# Patient Record
Sex: Male | Born: 1970 | Race: Black or African American | Hispanic: No | Marital: Single | Smoking: Current every day smoker
Health system: Southern US, Community
[De-identification: ages and names within clinical notes are randomized; demographics above are authoritative.]

## PROBLEM LIST (undated history)

## (undated) DIAGNOSIS — E7800 Pure hypercholesterolemia, unspecified: Secondary | ICD-10-CM

## (undated) DIAGNOSIS — I10 Essential (primary) hypertension: Secondary | ICD-10-CM

## (undated) DIAGNOSIS — F250 Schizoaffective disorder, bipolar type: Secondary | ICD-10-CM

## (undated) DIAGNOSIS — F259 Schizoaffective disorder, unspecified: Secondary | ICD-10-CM

## (undated) HISTORY — PX: CHOLECYSTECTOMY: SHX55

---

## 2005-10-20 ENCOUNTER — Ambulatory Visit: Payer: Self-pay | Admitting: Nurse Practitioner

## 2005-10-22 ENCOUNTER — Ambulatory Visit: Payer: Self-pay | Admitting: *Deleted

## 2005-10-26 ENCOUNTER — Ambulatory Visit: Payer: Self-pay | Admitting: Nurse Practitioner

## 2005-11-01 ENCOUNTER — Ambulatory Visit: Payer: Self-pay | Admitting: Nurse Practitioner

## 2005-11-26 ENCOUNTER — Ambulatory Visit: Payer: Self-pay | Admitting: Nurse Practitioner

## 2006-08-02 ENCOUNTER — Emergency Department (HOSPITAL_COMMUNITY): Admission: EM | Admit: 2006-08-02 | Discharge: 2006-08-02 | Payer: Self-pay | Admitting: Emergency Medicine

## 2006-12-27 ENCOUNTER — Emergency Department (HOSPITAL_COMMUNITY): Admission: EM | Admit: 2006-12-27 | Discharge: 2006-12-28 | Payer: Self-pay | Admitting: Emergency Medicine

## 2007-03-24 ENCOUNTER — Emergency Department (HOSPITAL_COMMUNITY): Admission: EM | Admit: 2007-03-24 | Discharge: 2007-03-24 | Payer: Self-pay | Admitting: Emergency Medicine

## 2007-06-05 DIAGNOSIS — F528 Other sexual dysfunction not due to a substance or known physiological condition: Secondary | ICD-10-CM | POA: Insufficient documentation

## 2007-06-05 DIAGNOSIS — F259 Schizoaffective disorder, unspecified: Secondary | ICD-10-CM

## 2007-06-20 ENCOUNTER — Emergency Department (HOSPITAL_COMMUNITY): Admission: EM | Admit: 2007-06-20 | Discharge: 2007-06-20 | Payer: Self-pay | Admitting: Emergency Medicine

## 2008-11-15 ENCOUNTER — Ambulatory Visit: Payer: Self-pay | Admitting: Family Medicine

## 2008-11-18 ENCOUNTER — Ambulatory Visit: Payer: Self-pay | Admitting: Internal Medicine

## 2008-11-29 ENCOUNTER — Encounter (INDEPENDENT_AMBULATORY_CARE_PROVIDER_SITE_OTHER): Payer: Self-pay | Admitting: Internal Medicine

## 2008-11-29 ENCOUNTER — Ambulatory Visit: Payer: Self-pay | Admitting: Internal Medicine

## 2008-11-29 LAB — CONVERTED CEMR LAB
Alkaline Phosphatase: 60 units/L (ref 39–117)
Glucose, Bld: 90 mg/dL (ref 70–99)
LDL Cholesterol: 115 mg/dL — ABNORMAL HIGH (ref 0–99)
Sodium: 140 meq/L (ref 135–145)
Total Bilirubin: 0.3 mg/dL (ref 0.3–1.2)
Total Protein: 6.8 g/dL (ref 6.0–8.3)
Triglycerides: 96 mg/dL (ref <150)
VLDL: 19 mg/dL (ref 0–40)

## 2009-05-20 ENCOUNTER — Ambulatory Visit: Payer: Self-pay | Admitting: Internal Medicine

## 2009-05-20 LAB — CONVERTED CEMR LAB
Barbiturate Quant, Ur: NEGATIVE
Basophils Absolute: 0 10*3/uL (ref 0.0–0.1)
Basophils Relative: 0 % (ref 0–1)
Creatinine,U: 232.9 mg/dL
Eosinophils Absolute: 0.2 10*3/uL (ref 0.0–0.7)
Lymphs Abs: 2.2 10*3/uL (ref 0.7–4.0)
MCHC: 33.5 g/dL (ref 30.0–36.0)
MCV: 80.4 fL (ref 78.0–100.0)
Neutrophils Relative %: 66 % (ref 43–77)
Opiate Screen, Urine: NEGATIVE
Platelets: 311 10*3/uL (ref 150–400)
Propoxyphene: NEGATIVE
RDW: 15.2 % (ref 11.5–15.5)
WBC: 9.7 10*3/uL (ref 4.0–10.5)

## 2009-05-27 ENCOUNTER — Ambulatory Visit: Payer: Self-pay | Admitting: Internal Medicine

## 2009-06-09 ENCOUNTER — Ambulatory Visit: Payer: Self-pay | Admitting: Internal Medicine

## 2009-06-25 ENCOUNTER — Ambulatory Visit (HOSPITAL_BASED_OUTPATIENT_CLINIC_OR_DEPARTMENT_OTHER): Admission: RE | Admit: 2009-06-25 | Discharge: 2009-06-25 | Payer: Self-pay | Admitting: Internal Medicine

## 2009-06-28 ENCOUNTER — Ambulatory Visit: Payer: Self-pay | Admitting: Internal Medicine

## 2009-07-07 ENCOUNTER — Encounter (INDEPENDENT_AMBULATORY_CARE_PROVIDER_SITE_OTHER): Payer: Self-pay | Admitting: Internal Medicine

## 2009-07-07 ENCOUNTER — Ambulatory Visit: Payer: Self-pay | Admitting: Internal Medicine

## 2009-07-08 ENCOUNTER — Encounter (INDEPENDENT_AMBULATORY_CARE_PROVIDER_SITE_OTHER): Payer: Self-pay | Admitting: Internal Medicine

## 2009-07-22 ENCOUNTER — Ambulatory Visit: Payer: Self-pay | Admitting: Internal Medicine

## 2009-08-19 ENCOUNTER — Ambulatory Visit: Payer: Self-pay | Admitting: Internal Medicine

## 2009-10-31 ENCOUNTER — Ambulatory Visit: Payer: Self-pay | Admitting: Internal Medicine

## 2009-12-03 ENCOUNTER — Encounter (INDEPENDENT_AMBULATORY_CARE_PROVIDER_SITE_OTHER): Payer: Self-pay | Admitting: Internal Medicine

## 2009-12-03 ENCOUNTER — Ambulatory Visit: Payer: Self-pay | Admitting: Family Medicine

## 2009-12-03 LAB — CONVERTED CEMR LAB
BUN: 12 mg/dL (ref 6–23)
Cholesterol: 162 mg/dL (ref 0–200)
Creatinine, Ser: 0.97 mg/dL (ref 0.40–1.50)
Glucose, Bld: 135 mg/dL — ABNORMAL HIGH (ref 70–99)
LDL Cholesterol: 114 mg/dL — ABNORMAL HIGH (ref 0–99)
Triglycerides: 84 mg/dL (ref <150)

## 2010-01-06 ENCOUNTER — Ambulatory Visit: Payer: Self-pay | Admitting: Internal Medicine

## 2010-02-11 ENCOUNTER — Ambulatory Visit: Payer: Self-pay | Admitting: Internal Medicine

## 2010-02-11 LAB — CONVERTED CEMR LAB: Testosterone: 166.99 ng/dL — ABNORMAL LOW (ref 350–890)

## 2010-02-17 ENCOUNTER — Encounter: Admission: RE | Admit: 2010-02-17 | Discharge: 2010-04-07 | Payer: Self-pay | Admitting: Internal Medicine

## 2010-02-27 ENCOUNTER — Ambulatory Visit: Payer: Self-pay | Admitting: Internal Medicine

## 2010-03-17 ENCOUNTER — Ambulatory Visit: Payer: Self-pay | Admitting: Internal Medicine

## 2010-07-23 ENCOUNTER — Encounter: Admission: RE | Admit: 2010-07-23 | Discharge: 2010-07-23 | Payer: Self-pay | Admitting: Family Medicine

## 2011-03-11 ENCOUNTER — Emergency Department (HOSPITAL_COMMUNITY): Payer: Medicaid Other

## 2011-03-11 ENCOUNTER — Emergency Department (HOSPITAL_COMMUNITY)
Admission: EM | Admit: 2011-03-11 | Discharge: 2011-03-11 | Disposition: A | Discharge status: Home / Self Care | Payer: Medicaid Other | Attending: Emergency Medicine | Admitting: Emergency Medicine

## 2011-03-11 DIAGNOSIS — R079 Chest pain, unspecified: Secondary | ICD-10-CM | POA: Insufficient documentation

## 2011-03-11 DIAGNOSIS — J40 Bronchitis, not specified as acute or chronic: Secondary | ICD-10-CM | POA: Insufficient documentation

## 2011-03-11 DIAGNOSIS — I10 Essential (primary) hypertension: Secondary | ICD-10-CM | POA: Insufficient documentation

## 2011-03-11 DIAGNOSIS — R05 Cough: Secondary | ICD-10-CM | POA: Insufficient documentation

## 2011-03-11 DIAGNOSIS — R0602 Shortness of breath: Secondary | ICD-10-CM | POA: Insufficient documentation

## 2011-03-11 DIAGNOSIS — R0989 Other specified symptoms and signs involving the circulatory and respiratory systems: Secondary | ICD-10-CM | POA: Insufficient documentation

## 2011-03-11 DIAGNOSIS — F1910 Other psychoactive substance abuse, uncomplicated: Secondary | ICD-10-CM | POA: Insufficient documentation

## 2011-03-11 DIAGNOSIS — H81399 Other peripheral vertigo, unspecified ear: Secondary | ICD-10-CM | POA: Insufficient documentation

## 2011-03-11 DIAGNOSIS — R059 Cough, unspecified: Secondary | ICD-10-CM | POA: Insufficient documentation

## 2011-03-11 DIAGNOSIS — R0609 Other forms of dyspnea: Secondary | ICD-10-CM | POA: Insufficient documentation

## 2011-06-21 LAB — BASIC METABOLIC PANEL
BUN: 10
Calcium: 9
GFR calc non Af Amer: >60
Glucose, Bld: 98
Potassium: 3.6

## 2011-06-21 LAB — URINALYSIS, ROUTINE W REFLEX MICROSCOPIC
Glucose, UA: NEGATIVE
Leukocytes, UA: NEGATIVE
Nitrite: NEGATIVE
Protein, ur: NEGATIVE
pH: 6.5

## 2011-06-21 LAB — DIFFERENTIAL
Basophils Absolute: 0.1
Eosinophils Relative: 3
Lymphocytes Relative: 25
Lymphs Abs: 2.5
Neutro Abs: 6.2
Neutrophils Relative %: 63

## 2011-06-21 LAB — HEPATIC FUNCTION PANEL
ALT: 14
AST: 13
Albumin: 3.7
Alkaline Phosphatase: 38 — ABNORMAL LOW
Total Protein: 6.9

## 2011-06-21 LAB — CBC
HCT: 43.8
Platelets: 284
RDW: 14.7 — ABNORMAL HIGH
WBC: 9.9

## 2011-06-21 LAB — URINE MICROSCOPIC-ADD ON

## 2012-06-07 ENCOUNTER — Encounter (HOSPITAL_COMMUNITY): Payer: Self-pay | Admitting: *Deleted

## 2012-06-07 ENCOUNTER — Emergency Department (HOSPITAL_COMMUNITY)
Admission: EM | Admit: 2012-06-07 | Discharge: 2012-06-08 | Disposition: A | Discharge status: Home / Self Care | Payer: Medicaid Other | Attending: Emergency Medicine | Admitting: Emergency Medicine

## 2012-06-07 DIAGNOSIS — R45851 Suicidal ideations: Secondary | ICD-10-CM | POA: Insufficient documentation

## 2012-06-07 DIAGNOSIS — F259 Schizoaffective disorder, unspecified: Secondary | ICD-10-CM | POA: Insufficient documentation

## 2012-06-07 DIAGNOSIS — I10 Essential (primary) hypertension: Secondary | ICD-10-CM | POA: Insufficient documentation

## 2012-06-07 DIAGNOSIS — F329 Major depressive disorder, single episode, unspecified: Secondary | ICD-10-CM

## 2012-06-07 DIAGNOSIS — F3289 Other specified depressive episodes: Secondary | ICD-10-CM | POA: Insufficient documentation

## 2012-06-07 DIAGNOSIS — E7800 Pure hypercholesterolemia, unspecified: Secondary | ICD-10-CM | POA: Insufficient documentation

## 2012-06-07 DIAGNOSIS — F32A Depression, unspecified: Secondary | ICD-10-CM

## 2012-06-07 HISTORY — DX: Pure hypercholesterolemia, unspecified: E78.00

## 2012-06-07 HISTORY — DX: Schizoaffective disorder, unspecified: F25.9

## 2012-06-07 HISTORY — DX: Schizoaffective disorder, bipolar type: F25.0

## 2012-06-07 HISTORY — DX: Essential (primary) hypertension: I10

## 2012-06-07 LAB — COMPREHENSIVE METABOLIC PANEL
ALT: 13 U/L (ref 0–53)
Alkaline Phosphatase: 55 U/L (ref 39–117)
BUN: 9 mg/dL (ref 6–23)
CO2: 29 mEq/L (ref 19–32)
Calcium: 9 mg/dL (ref 8.4–10.5)
GFR calc Af Amer: >90 mL/min (ref >90)
GFR calc non Af Amer: 81 mL/min — ABNORMAL LOW (ref >90)
Glucose, Bld: 123 mg/dL — ABNORMAL HIGH (ref 70–99)
Sodium: 140 mEq/L (ref 135–145)
Total Protein: 6.6 g/dL (ref 6.0–8.3)

## 2012-06-07 LAB — URINALYSIS, ROUTINE W REFLEX MICROSCOPIC
Glucose, UA: NEGATIVE mg/dL
Leukocytes, UA: NEGATIVE
Nitrite: NEGATIVE
Specific Gravity, Urine: 1.024 (ref 1.005–1.030)
pH: 8 (ref 5.0–8.0)

## 2012-06-07 LAB — ETHANOL: Alcohol, Ethyl (B): <11 mg/dL (ref 0–11)

## 2012-06-07 LAB — RAPID URINE DRUG SCREEN, HOSP PERFORMED
Barbiturates: NOT DETECTED
Benzodiazepines: NOT DETECTED
Cocaine: POSITIVE — AB

## 2012-06-07 LAB — CBC
HCT: 45.1 % (ref 39.0–52.0)
Hemoglobin: 14.7 g/dL (ref 13.0–17.0)
MCH: 26.9 pg (ref 26.0–34.0)
MCHC: 32.6 g/dL (ref 30.0–36.0)
MCV: 82.6 fL (ref 78.0–100.0)
RBC: 5.46 MIL/uL (ref 4.22–5.81)

## 2012-06-07 LAB — URINE MICROSCOPIC-ADD ON

## 2012-06-07 NOTE — ED Notes (Signed)
Pt brought in by [facility] under IVC for c/o hearing voices; states has been taking his meds off and on; using crack cocaine x 2 wks; now homeless; states having thoughts of hurting himself

## 2012-06-08 NOTE — ED Provider Notes (Signed)
History     CSN: [id number]  Arrival date & time 06/07/12  2217   First MD Initiated Contact with Patient 06/08/12 0010      Chief Complaint  Patient presents with  . Medical Clearance    (Consider location/radiation/quality/duration/timing/severity/associated sxs/prior treatment) HPI Patient presents emergency department with depression and suicidal thoughts and increased anger.  Patient, states, that been ongoing for several months.  Patient, states, in hearing some voices, as well.  Patient denies chest pain, shortness of breath, nausea, vomiting, weakness, headache, blurred vision, or fever.  Patient, states, that he says became medications, but has not been doing so for his depression.  Past Medical History  Diagnosis Date  . Schizo affective schizophrenia   . Hypertension   . Hypercholesteremia     Past Surgical History  Procedure Date  . Cholecystectomy     No family history on file.  History  Substance Use Topics  . Smoking status: Current Every Day Smoker -- 0.5 packs/day    Types: Cigarettes  . Smokeless tobacco: Not on file  . Alcohol Use: 1.2 oz/week    2 Cans of beer per week      Review of Systems All other systems negative except as documented in the HPI. All pertinent positives and negatives as reviewed in the HPI.  Allergies  Ibuprofen  Home Medications   Current Outpatient Rx  Name Route Sig Dispense Refill  . CLONIDINE HCL ER PO Oral Take 1 tablet by mouth daily.    Marland Kitchen DIVALPROEX SODIUM 500 MG PO TBEC Oral Take 500 mg by mouth 3 (three) times daily. 1 tab morning, 1 noon, 1 at night for seizures    . MELOXICAM 15 MG PO TABS Oral Take 15 mg by mouth daily.    Marland Kitchen RISPERIDONE 2 MG PO TABS Oral Take 2 mg by mouth 2 (two) times daily.    Marland Kitchen RISPERIDONE MICROSPHERES 50 MG IM SUSR Intramuscular Inject 50 mg into the muscle every 14 (fourteen) days.      BP 124/81  Pulse 90  Temp 98.6 F (37 C) (Oral)  Resp 20  SpO2 96%  Physical Exam    Nursing note and vitals reviewed. Constitutional: He is oriented to person, place, and time. He appears well-developed and well-nourished. No distress.  HENT:  Head: Normocephalic and atraumatic.  Mouth/Throat: Oropharynx is clear and moist.  Eyes: Pupils are equal, round, and reactive to light.  Neck: Normal range of motion. Neck supple.  Cardiovascular: Normal rate, regular rhythm and normal heart sounds.  Exam reveals no gallop and no friction rub.   No murmur heard. Pulmonary/Chest: Effort normal and breath sounds normal.  Neurological: He is alert and oriented to person, place, and time.  Psychiatric: His speech is normal. Cognition and memory are normal. He exhibits a depressed mood. He expresses suicidal ideation.    ED Course  Procedures (including critical care time)  Labs Reviewed  COMPREHENSIVE METABOLIC PANEL - Abnormal; Notable for the following:    Glucose, Bld 123 (*)     Albumin 3.4 (*)     Total Bilirubin 0.2 (*)     GFR calc non Af Amer 81 (*)     All other components within normal limits  URINE RAPID DRUG SCREEN (HOSP PERFORMED) - Abnormal; Notable for the following:    Cocaine POSITIVE (*)     Tetrahydrocannabinol POSITIVE (*)     All other components within normal limits  URINALYSIS, ROUTINE W REFLEX MICROSCOPIC - Abnormal; Notable for  the following:    APPearance TURBID (*)     Bilirubin Urine SMALL (*)     Protein, ur 30 (*)     All other components within normal limits  CBC  ETHANOL  URINE MICROSCOPIC-ADD ON   Patient will be sent back to [facility] for further psychiatric evaluation.    MDM  MDM Reviewed: vitals and nursing note Interpretation: labs            Carlyle Dolly, PA-C 06/08/12 0037

## 2012-06-08 NOTE — ED Provider Notes (Signed)
Medical screening examination/treatment/procedure(s) were performed by non-physician practitioner and as supervising physician I was immediately available for consultation/collaboration.   Charles B. Bernette Mayers, MD 06/08/12 [phone]

## 2015-02-12 ENCOUNTER — Emergency Department (HOSPITAL_COMMUNITY)
Admission: EM | Admit: 2015-02-12 | Discharge: 2015-02-12 | Disposition: A | Discharge status: Home / Self Care | Payer: Medicaid Other | Attending: Emergency Medicine | Admitting: Emergency Medicine

## 2015-02-12 ENCOUNTER — Encounter (HOSPITAL_COMMUNITY): Payer: Self-pay | Admitting: *Deleted

## 2015-02-12 ENCOUNTER — Emergency Department (HOSPITAL_COMMUNITY): Payer: Medicaid Other

## 2015-02-12 DIAGNOSIS — J40 Bronchitis, not specified as acute or chronic: Secondary | ICD-10-CM | POA: Diagnosis not present

## 2015-02-12 DIAGNOSIS — Z8639 Personal history of other endocrine, nutritional and metabolic disease: Secondary | ICD-10-CM | POA: Diagnosis not present

## 2015-02-12 DIAGNOSIS — Z720 Tobacco use: Secondary | ICD-10-CM | POA: Insufficient documentation

## 2015-02-12 DIAGNOSIS — Z79899 Other long term (current) drug therapy: Secondary | ICD-10-CM | POA: Diagnosis not present

## 2015-02-12 DIAGNOSIS — I10 Essential (primary) hypertension: Secondary | ICD-10-CM | POA: Diagnosis not present

## 2015-02-12 DIAGNOSIS — F250 Schizoaffective disorder, bipolar type: Secondary | ICD-10-CM | POA: Insufficient documentation

## 2015-02-12 DIAGNOSIS — R079 Chest pain, unspecified: Secondary | ICD-10-CM | POA: Diagnosis present

## 2015-02-12 LAB — COMPREHENSIVE METABOLIC PANEL
ALT: 16 U/L — AB (ref 17–63)
AST: 15 U/L (ref 15–41)
Albumin: 3.8 g/dL (ref 3.5–5.0)
Alkaline Phosphatase: 71 U/L (ref 38–126)
Anion gap: 8 (ref 5–15)
BUN: 11 mg/dL (ref 6–20)
CALCIUM: 9 mg/dL (ref 8.9–10.3)
CHLORIDE: 105 mmol/L (ref 101–111)
CO2: 22 mmol/L (ref 22–32)
CREATININE: 1 mg/dL (ref 0.61–1.24)
GFR calc Af Amer: >60 mL/min (ref >60)
GLUCOSE: 75 mg/dL (ref 65–99)
Potassium: 4 mmol/L (ref 3.5–5.1)
SODIUM: 135 mmol/L (ref 135–145)
Total Bilirubin: 0.6 mg/dL (ref 0.3–1.2)
Total Protein: 7.2 g/dL (ref 6.5–8.1)

## 2015-02-12 LAB — CBC
HEMATOCRIT: 48.9 % (ref 39.0–52.0)
Hemoglobin: 16.4 g/dL (ref 13.0–17.0)
MCH: 27.5 pg (ref 26.0–34.0)
MCHC: 33.5 g/dL (ref 30.0–36.0)
MCV: 82 fL (ref 78.0–100.0)
Platelets: 265 10*3/uL (ref 150–400)
RBC: 5.96 MIL/uL — AB (ref 4.22–5.81)
RDW: 14.1 % (ref 11.5–15.5)
WBC: 11.5 10*3/uL — AB (ref 4.0–10.5)

## 2015-02-12 LAB — I-STAT TROPONIN, ED: TROPONIN I, POC: 0 ng/mL (ref 0.00–0.08)

## 2015-02-12 MED ORDER — MELOXICAM 15 MG PO TABS
15.0000 mg | ORAL_TABLET | Freq: Every day | ORAL | Status: DC
Start: 2015-02-12 — End: 2015-04-29

## 2015-02-12 MED ORDER — ALBUTEROL SULFATE HFA 108 (90 BASE) MCG/ACT IN AERS: 1.0000 | INHALATION_SPRAY | Freq: Four times a day (QID) | RESPIRATORY_TRACT | Status: AC | PRN

## 2015-02-12 MED ORDER — HYDROCHLOROTHIAZIDE 25 MG PO TABS: 25.0000 mg | ORAL_TABLET | Freq: Every day | ORAL | Status: DC

## 2015-02-12 MED ORDER — BENZONATATE 100 MG PO CAPS: 100.0000 mg | ORAL_CAPSULE | Freq: Three times a day (TID) | ORAL | Status: DC

## 2015-02-12 NOTE — ED Notes (Signed)
Pt reports right sided chest pain x 4 days. Worse with movement or deep breaths. No previous MI. States has been out of blood pressure medication for several months. +shortness of breath and lightheaded at times.

## 2015-02-12 NOTE — ED Provider Notes (Signed)
CSN: [id number]     Arrival date & time 02/12/15  1348 History   First MD Initiated Contact with Patient 02/12/15 1615     Chief Complaint  Patient presents with  . Chest Pain     (Consider location/radiation/quality/duration/timing/severity/associated sxs/prior Treatment) Patient is a 44 y.o. male presenting with chest pain.  Chest Pain Pain location:  R lateral chest Pain quality: aching and sharp   Pain radiates to:  Does not radiate Pain radiates to the back: no   Pain severity:  Moderate Onset quality:  Gradual Duration:  4 days Timing:  Constant Progression:  Waxing and waning Chronicity:  New Context: breathing and at rest   Relieved by:  Nothing Worsened by:  Nothing tried Ineffective treatments:  None tried Associated symptoms: cough (and congestion)   Associated symptoms: no abdominal pain, no fever, no nausea, no near-syncope, no shortness of breath and not vomiting   Risk factors: high cholesterol, hypertension and male sex   Risk factors: no coronary artery disease     Past Medical History  Diagnosis Date  . Schizo affective schizophrenia   . Hypertension   . Hypercholesteremia    Past Surgical History  Procedure Laterality Date  . Cholecystectomy     No family history on file. History  Substance Use Topics  . Smoking status: Current Every Day Smoker -- 0.50 packs/day    Types: Cigarettes  . Smokeless tobacco: Not on file  . Alcohol Use: 1.2 oz/week    2 Cans of beer per week    Review of Systems  Constitutional: Negative for fever.  Respiratory: Positive for cough (and congestion). Negative for shortness of breath.   Cardiovascular: Positive for chest pain. Negative for near-syncope.  Gastrointestinal: Negative for nausea, vomiting and abdominal pain.  All other systems reviewed and are negative.     Allergies  Ibuprofen  Home Medications   Prior to Admission medications   Medication Sig Start Date End Date Taking? Authorizing Provider   divalproex (DEPAKOTE) 500 MG DR tablet Take 500 mg by mouth 3 (three) times daily. 1 tab morning, 1 noon, 1 at night for seizures   Yes Historical Provider, MD  risperiDONE (RISPERDAL) 2 MG tablet Take 2 mg by mouth 2 (two) times daily.   Yes Historical Provider, MD  risperiDONE microspheres (RISPERDAL CONSTA) 50 MG injection Inject 50 mg into the muscle every 14 (fourteen) days. Patient states his next dose is due on 02-14-15   Yes Historical Provider, MD  albuterol (PROVENTIL HFA;VENTOLIN HFA) 108 (90 BASE) MCG/ACT inhaler Inhale 1-2 puffs into the lungs every 6 (six) hours as needed for wheezing or shortness of breath. 02/12/15   Leo Grosser, MD  benzonatate (TESSALON) 100 MG capsule Take 1 capsule (100 mg total) by mouth every 8 (eight) hours. 02/12/15   Leo Grosser, MD  hydrochlorothiazide (HYDRODIURIL) 25 MG tablet Take 1 tablet (25 mg total) by mouth daily. 02/12/15   Leo Grosser, MD  meloxicam (MOBIC) 15 MG tablet Take 1 tablet (15 mg total) by mouth daily. 02/12/15   Leo Grosser, MD   BP 179/102 mmHg  Pulse 92  Temp(Src) 98.2 F (36.8 C) (Oral)  Resp 28  SpO2 97% Physical Exam  Constitutional: He is oriented to person, place, and time. He appears well-developed and well-nourished. No distress.  HENT:  Head: Normocephalic and atraumatic.  Eyes: Conjunctivae are normal.  Neck: Neck supple. No tracheal deviation present.  Cardiovascular: Normal rate and regular rhythm.   Pulmonary/Chest: Effort normal. No respiratory  distress. He has wheezes (mild expiratory). He exhibits tenderness (over right lateral chest below axilla, worse with movement). He exhibits no bony tenderness and no crepitus.  Abdominal: Soft. He exhibits no distension.  Neurological: He is alert and oriented to person, place, and time.  Skin: Skin is warm and dry.  Psychiatric: He has a normal mood and affect.    ED Course  Procedures (including critical care time) Labs Review Labs Reviewed  CBC - Abnormal; Notable  for the following:    WBC 11.5 (*)    RBC 5.96 (*)    All other components within normal limits  COMPREHENSIVE METABOLIC PANEL - Abnormal; Notable for the following:    ALT 16 (*)    All other components within normal limits  I-STAT TROPOININ, ED    Imaging Review Dg Chest 2 View  02/12/2015   CLINICAL DATA:  Chest pain.  Shortness of breath.  EXAM: CHEST  2 VIEW  COMPARISON:  03/11/2011.  FINDINGS: Mediastinum hilar structures normal. Lungs are clear. Heart size normal. No pleural effusion or pneumothorax. Degenerative changes thoracic spine .  IMPRESSION: No acute cardiopulmonary disease.   Electronically Signed   By: Marcello Moores  Register   On: 02/12/2015 14:22     EKG Interpretation   Date/Time:  Wednesday February 12 2015 13:52:50 EDT Ventricular Rate:  88 PR Interval:  150 QRS Duration: 72 QT Interval:  348 QTC Calculation: 421 R Axis:   68 Text Interpretation:  Normal sinus rhythm Nonspecific ST abnormality  Abnormal ECG No old tracing to compare Confirmed by Kathrynn Humble, MD, Thelma Comp  [phone]) on 02/12/2015 4:43:01 PM      MDM   Final diagnoses:  Bronchitis   44 year old male with prior history of hypertension and hypercholesterolemia presents with 4 days of cough and right upper chest pain that is reproducible and worse only when coughing. He states he had a sick contact that may have given him a virus. He has mild end expiratory wheezing and coughing on exam without any hypoxemia or dyspnea. He was provided with albuterol, Tessalon, and a refill of his meloxicam and will be started on a low dose of HCTZ for his hypertension for the next week as he plans to follow up with his primary care physician for definitive management of his ongoing medical problems.  Leo Grosser, MD 02/12/15 [place], MD 02/13/15 [id number]

## 2015-02-12 NOTE — Discharge Instructions (Signed)

## 2015-04-22 ENCOUNTER — Encounter (HOSPITAL_COMMUNITY): Payer: Self-pay | Admitting: Emergency Medicine

## 2015-04-22 ENCOUNTER — Emergency Department (HOSPITAL_COMMUNITY)
Admission: EM | Admit: 2015-04-22 | Discharge: 2015-04-23 | Disposition: A | Discharge status: Other Acute Inpatient Hospital | Payer: Medicaid Other | Attending: Emergency Medicine | Admitting: Emergency Medicine

## 2015-04-22 ENCOUNTER — Emergency Department (HOSPITAL_COMMUNITY): Payer: Medicaid Other

## 2015-04-22 DIAGNOSIS — Z79899 Other long term (current) drug therapy: Secondary | ICD-10-CM | POA: Diagnosis not present

## 2015-04-22 DIAGNOSIS — Z8639 Personal history of other endocrine, nutritional and metabolic disease: Secondary | ICD-10-CM | POA: Insufficient documentation

## 2015-04-22 DIAGNOSIS — Z720 Tobacco use: Secondary | ICD-10-CM | POA: Diagnosis not present

## 2015-04-22 DIAGNOSIS — R45851 Suicidal ideations: Secondary | ICD-10-CM | POA: Diagnosis present

## 2015-04-22 DIAGNOSIS — I10 Essential (primary) hypertension: Secondary | ICD-10-CM | POA: Insufficient documentation

## 2015-04-22 DIAGNOSIS — F259 Schizoaffective disorder, unspecified: Secondary | ICD-10-CM | POA: Diagnosis present

## 2015-04-22 DIAGNOSIS — R440 Auditory hallucinations: Secondary | ICD-10-CM | POA: Insufficient documentation

## 2015-04-22 DIAGNOSIS — R443 Hallucinations, unspecified: Secondary | ICD-10-CM

## 2015-04-22 LAB — CBC
HEMATOCRIT: 42.2 % (ref 39.0–52.0)
HEMOGLOBIN: 13.4 g/dL (ref 13.0–17.0)
MCH: 26.6 pg (ref 26.0–34.0)
MCHC: 31.8 g/dL (ref 30.0–36.0)
MCV: 83.7 fL (ref 78.0–100.0)
Platelets: 337 10*3/uL (ref 150–400)
RBC: 5.04 MIL/uL (ref 4.22–5.81)
RDW: 15 % (ref 11.5–15.5)
WBC: 8.1 10*3/uL (ref 4.0–10.5)

## 2015-04-22 LAB — ETHANOL: Alcohol, Ethyl (B): <5 mg/dL (ref <5)

## 2015-04-22 LAB — COMPREHENSIVE METABOLIC PANEL
ALK PHOS: 49 U/L (ref 38–126)
ALT: 13 U/L — AB (ref 17–63)
AST: 19 U/L (ref 15–41)
Albumin: 3.7 g/dL (ref 3.5–5.0)
Anion gap: 6 (ref 5–15)
BILIRUBIN TOTAL: 0.5 mg/dL (ref 0.3–1.2)
BUN: 11 mg/dL (ref 6–20)
CALCIUM: 8.8 mg/dL — AB (ref 8.9–10.3)
CO2: 26 mmol/L (ref 22–32)
CREATININE: 1.08 mg/dL (ref 0.61–1.24)
Chloride: 108 mmol/L (ref 101–111)
GFR calc non Af Amer: >60 mL/min (ref >60)
GLUCOSE: 104 mg/dL — AB (ref 65–99)
Potassium: 3.5 mmol/L (ref 3.5–5.1)
SODIUM: 140 mmol/L (ref 135–145)
Total Protein: 6.8 g/dL (ref 6.5–8.1)

## 2015-04-22 LAB — RAPID URINE DRUG SCREEN, HOSP PERFORMED
Amphetamines: NOT DETECTED
BARBITURATES: NOT DETECTED
Benzodiazepines: NOT DETECTED
Cocaine: POSITIVE — AB
Opiates: NOT DETECTED
TETRAHYDROCANNABINOL: POSITIVE — AB

## 2015-04-22 LAB — VALPROIC ACID LEVEL

## 2015-04-22 LAB — SALICYLATE LEVEL

## 2015-04-22 LAB — ACETAMINOPHEN LEVEL: Acetaminophen (Tylenol), Serum: <10 ug/mL — ABNORMAL LOW (ref 10–30)

## 2015-04-22 MED ORDER — ACETAMINOPHEN 325 MG PO TABS
650.0000 mg | ORAL_TABLET | ORAL | Status: DC | PRN
  Administered 2015-04-23 (×2): 650 mg via ORAL
  Filled 2015-04-22 (×2): qty 2

## 2015-04-22 MED ORDER — LORAZEPAM 1 MG PO TABS
1.0000 mg | ORAL_TABLET | Freq: Three times a day (TID) | ORAL | Status: DC | PRN
  Administered 2015-04-23: 1 mg via ORAL
  Filled 2015-04-22: qty 1

## 2015-04-22 MED ORDER — HYDROCHLOROTHIAZIDE 25 MG PO TABS
25.0000 mg | ORAL_TABLET | Freq: Every day | ORAL | Status: DC
  Administered 2015-04-23: 25 mg via ORAL
  Filled 2015-04-22: qty 1

## 2015-04-22 MED ORDER — ONDANSETRON HCL 4 MG PO TABS: 4.0000 mg | ORAL_TABLET | Freq: Three times a day (TID) | ORAL | Status: DC | PRN

## 2015-04-22 MED ORDER — ZOLPIDEM TARTRATE 5 MG PO TABS: 5.0000 mg | ORAL_TABLET | Freq: Every evening | ORAL | Status: DC | PRN

## 2015-04-22 MED ORDER — NICOTINE 21 MG/24HR TD PT24
21.0000 mg | MEDICATED_PATCH | Freq: Every day | TRANSDERMAL | Status: DC
  Administered 2015-04-23: 21 mg via TRANSDERMAL
  Filled 2015-04-22: qty 1
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
## 2015-04-22 NOTE — Progress Notes (Addendum)
Patient was referred at: [facility] - per intake, fax referral. [facility] - per Juliann Pulse, fax referral. [facility] - per Faythe Dingwall, fax it for wait-list. [facility] - per Burman Nieves, fax referral for wait-list. [facility] - per Pamala Hurry, fax referral for review. [facility] - per intake, fax it for review.  At capacity: [facility] - per Lamar Benes - per Audree Bane - per [place], [place] Disposition staff 04/22/2015 10:08 PM

## 2015-04-22 NOTE — ED Provider Notes (Signed)
2215 - Labs and imaging reviewed. Patient medically cleared.  Filed Vitals:   04/22/15 1855  BP: 147/96  Pulse: 70  Temp: 98 F (36.7 C)  TempSrc: Oral  Resp: 17  Height: 5\' 6"  (1.676 m)  SpO2: 99%     Antonietta Breach, PA-C 04/22/15 2215  Leo Grosser, MD 04/23/15 [phone]

## 2015-04-22 NOTE — ED Notes (Signed)
Bed: JHE17 Expected date:  Expected time:  Means of arrival:  Comments: Hold for D Clovis Riley

## 2015-04-22 NOTE — ED Notes (Signed)
Patient admits to [facility] with a plan to shoot himself with a gun. Patient reports he does not own a gun but could easily get one. Patient admits to command [place] telling him to hurt himself. Patient denies HI and VH at this time. Plan of care discussed with patient. Patient voices no complaints or concerns at this time. Encouragement and support provided and safety maintain. Q 15 min safety checks in place.
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
## 2015-04-22 NOTE — BH Assessment (Signed)
Reviewed ED notes prior to initiating assessment. Per notes pt has hx of schizophrenia and has been off of his medications. He has been smoking crack, having hallucinations, and SI. He called friends for help and they brought him to ED.   Assessment to commence shortly.    Lear Ng, [facility] Triage Specialist 04/22/2015 8:25 PM

## 2015-04-23 ENCOUNTER — Inpatient Hospital Stay (HOSPITAL_COMMUNITY)
Admission: AD | Admit: 2015-04-23 | Discharge: 2015-04-29 | DRG: 885 | Disposition: A | Discharge status: Home / Self Care | Payer: Medicaid Other | Source: Intra-hospital | Attending: Psychiatry | Admitting: Psychiatry

## 2015-04-23 ENCOUNTER — Encounter (HOSPITAL_COMMUNITY): Payer: Self-pay | Admitting: *Deleted

## 2015-04-23 DIAGNOSIS — R440 Auditory hallucinations: Secondary | ICD-10-CM | POA: Diagnosis not present

## 2015-04-23 DIAGNOSIS — I10 Essential (primary) hypertension: Secondary | ICD-10-CM | POA: Diagnosis present

## 2015-04-23 DIAGNOSIS — R45851 Suicidal ideations: Secondary | ICD-10-CM | POA: Diagnosis present

## 2015-04-23 DIAGNOSIS — F259 Schizoaffective disorder, unspecified: Secondary | ICD-10-CM

## 2015-04-23 DIAGNOSIS — F250 Schizoaffective disorder, bipolar type: Principal | ICD-10-CM | POA: Diagnosis present

## 2015-04-23 DIAGNOSIS — E785 Hyperlipidemia, unspecified: Secondary | ICD-10-CM | POA: Diagnosis present

## 2015-04-23 DIAGNOSIS — F17210 Nicotine dependence, cigarettes, uncomplicated: Secondary | ICD-10-CM | POA: Diagnosis present

## 2015-04-23 DIAGNOSIS — F1420 Cocaine dependence, uncomplicated: Secondary | ICD-10-CM | POA: Diagnosis present

## 2015-04-23 DIAGNOSIS — F17200 Nicotine dependence, unspecified, uncomplicated: Secondary | ICD-10-CM | POA: Diagnosis present

## 2015-04-23 DIAGNOSIS — F1020 Alcohol dependence, uncomplicated: Secondary | ICD-10-CM | POA: Diagnosis present

## 2015-04-23 DIAGNOSIS — F1220 Cannabis dependence, uncomplicated: Secondary | ICD-10-CM | POA: Diagnosis present

## 2015-04-23 DIAGNOSIS — E221 Hyperprolactinemia: Secondary | ICD-10-CM | POA: Clinically undetermined

## 2015-04-23 MED ORDER — HYDROCHLOROTHIAZIDE 25 MG PO TABS
25.0000 mg | ORAL_TABLET | Freq: Every day | ORAL | Status: DC
  Administered 2015-04-24 – 2015-04-29 (×6): 25 mg via ORAL
  Filled 2015-04-23 (×8): qty 1

## 2015-04-23 MED ORDER — TRAZODONE HCL 100 MG PO TABS
100.0000 mg | ORAL_TABLET | Freq: Every day | ORAL | Status: DC
  Administered 2015-04-23: 100 mg via ORAL
  Filled 2015-04-23 (×3): qty 1

## 2015-04-23 MED ORDER — TRAZODONE HCL 100 MG PO TABS: 100.0000 mg | ORAL_TABLET | Freq: Every day | ORAL | Status: DC

## 2015-04-23 MED ORDER — ENSURE ENLIVE PO LIQD
237.0000 mL | Freq: Two times a day (BID) | ORAL | Status: DC
  Administered 2015-04-24 – 2015-04-29 (×9): 237 mL via ORAL

## 2015-04-23 MED ORDER — AMANTADINE HCL 100 MG PO CAPS
100.0000 mg | ORAL_CAPSULE | Freq: Two times a day (BID) | ORAL | Status: DC
  Administered 2015-04-23: 100 mg via ORAL
  Filled 2015-04-23 (×2): qty 1

## 2015-04-23 MED ORDER — ARIPIPRAZOLE 5 MG PO TABS
5.0000 mg | ORAL_TABLET | Freq: Two times a day (BID) | ORAL | Status: DC
  Administered 2015-04-24: 5 mg via ORAL
  Filled 2015-04-23 (×4): qty 1

## 2015-04-23 MED ORDER — ARIPIPRAZOLE 5 MG PO TABS
5.0000 mg | ORAL_TABLET | Freq: Two times a day (BID) | ORAL | Status: DC
  Administered 2015-04-23: 5 mg via ORAL
  Filled 2015-04-23 (×2): qty 1

## 2015-04-23 MED ORDER — AMANTADINE HCL 100 MG PO CAPS
100.0000 mg | ORAL_CAPSULE | Freq: Two times a day (BID) | ORAL | Status: DC
  Administered 2015-04-23 – 2015-04-29 (×12): 100 mg via ORAL
  Filled 2015-04-23 (×5): qty 1
  Filled 2015-04-23: qty 6
  Filled 2015-04-23 (×4): qty 1
  Filled 2015-04-23: qty 6
  Filled 2015-04-23 (×7): qty 1

## 2015-04-23 MED ORDER — NICOTINE 21 MG/24HR TD PT24
21.0000 mg | MEDICATED_PATCH | Freq: Every day | TRANSDERMAL | Status: DC
  Administered 2015-04-24 – 2015-04-29 (×6): 21 mg via TRANSDERMAL
  Filled 2015-04-23: qty 3
  Filled 2015-04-23 (×9): qty 1
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
## 2015-04-23 NOTE — Tx Team (Addendum)
Initial Interdisciplinary Treatment Plan   PATIENT STRESSORS: Substance abuse Traumatic event   PATIENT STRENGTHS: Motivation for treatment/growth Communication skills   PROBLEM LIST: Problem List/Patient Goals Date to be addressed Date deferred Reason deferred Estimated date of resolution  Pt states "I want to live life clean and sober." "I want to see my little niece grow up."  04/23/15  04/23/15     SI 04/23/15     Substance abuse 04/23/15     Auditory Hallucinations 04/23/15                                    DISCHARGE CRITERIA:  Improved stabilization in mood, thinking, and/or behavior Need for constant or close observation no longer present Withdrawal symptoms are absent or subacute and managed without 24-hour nursing intervention  PRELIMINARY DISCHARGE PLAN: Attend aftercare/continuing care group Return to previous living arrangement  PATIENT/FAMIILY INVOLVEMENT: This treatment plan has been presented to and reviewed with the patient, Ryan Choi.  The patient and family have been given the opportunity to ask questions and make suggestions.  [name] 04/23/2015, 7:11 PM

## 2015-04-23 NOTE — Progress Notes (Signed)
Did not attend group 

## 2015-04-23 NOTE — BH Assessment (Signed)
[facility] Assessment Progress Note  Per Corena Pilgrim, MD, this pt requires psychiatric hospitalization at this time.  Clayborne Dana, RN, [facility] has assigned pt to Rm 505-2.  Pt has signed Voluntary Admission and Consent for Treatment, as well as Consent to Release Information to [facility], his outpatient provider, and a notification call has been placed.  Signed forms have been faxed to [facility].  Pt's nurse, Nicoletta Dress, has been notified, and agrees to send original paperwork along with pt via [facility], and to call report to [phone].   Jalene Mullet, [place] Triage Specialist [phone]

## 2015-04-23 NOTE — ED Notes (Signed)
Patient complains of headache and increase anxiety. Patient request Tylenol for pain. Patient denies allergies to tylenol. Patient rates pain 5/10 using numeric pain scale. Respirations equal and unlabored, skin warm and dry. NAD. Will medicate patient per MAR. Safety maintain and q 15 min safety checks remain in place.
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
## 2015-04-23 NOTE — ED Notes (Signed)
Pt discharged to [facility], pt teaching done and verbalized understanding, pt did not having any belongins. Pt's alert and oriented x 4, ambulatory. Pt denied pain at the time of discharge, pt also denied SI/HI. Pt transported through [facility]. Vitals within normal limit, no complains at the time of discharge.

## 2015-04-24 ENCOUNTER — Encounter (HOSPITAL_COMMUNITY): Payer: Self-pay | Admitting: Psychiatry

## 2015-04-24 DIAGNOSIS — F17200 Nicotine dependence, unspecified, uncomplicated: Secondary | ICD-10-CM | POA: Diagnosis present

## 2015-04-24 DIAGNOSIS — F1220 Cannabis dependence, uncomplicated: Secondary | ICD-10-CM | POA: Diagnosis present

## 2015-04-24 DIAGNOSIS — R45851 Suicidal ideations: Secondary | ICD-10-CM

## 2015-04-24 DIAGNOSIS — F1420 Cocaine dependence, uncomplicated: Secondary | ICD-10-CM | POA: Diagnosis present

## 2015-04-24 DIAGNOSIS — E785 Hyperlipidemia, unspecified: Secondary | ICD-10-CM | POA: Insufficient documentation

## 2015-04-24 DIAGNOSIS — R45850 Homicidal ideations: Secondary | ICD-10-CM

## 2015-04-24 DIAGNOSIS — I10 Essential (primary) hypertension: Secondary | ICD-10-CM | POA: Diagnosis present

## 2015-04-24 DIAGNOSIS — F1020 Alcohol dependence, uncomplicated: Secondary | ICD-10-CM | POA: Diagnosis present

## 2015-04-24 DIAGNOSIS — F250 Schizoaffective disorder, bipolar type: Principal | ICD-10-CM

## 2015-04-24 MED ORDER — NAPROXEN 375 MG PO TABS
375.0000 mg | ORAL_TABLET | Freq: Two times a day (BID) | ORAL | Status: DC
  Administered 2015-04-24 – 2015-04-29 (×11): 375 mg via ORAL
  Filled 2015-04-24 (×14): qty 1

## 2015-04-24 MED ORDER — FLUPHENAZINE HCL 5 MG PO TABS
5.0000 mg | ORAL_TABLET | Freq: Every evening | ORAL | Status: DC
  Administered 2015-04-24: 5 mg via ORAL
  Filled 2015-04-24 (×2): qty 1

## 2015-04-24 MED ORDER — ZOLPIDEM TARTRATE 5 MG PO TABS
5.0000 mg | ORAL_TABLET | Freq: Every day | ORAL | Status: DC
  Administered 2015-04-24 – 2015-04-25 (×2): 5 mg via ORAL
  Filled 2015-04-24 (×2): qty 1

## 2015-04-24 MED ORDER — LORAZEPAM 1 MG PO TABS
1.0000 mg | ORAL_TABLET | ORAL | Status: DC | PRN
  Administered 2015-04-24 – 2015-04-28 (×5): 1 mg via ORAL
  Filled 2015-04-24 (×5): qty 1

## 2015-04-24 MED ORDER — CARBAMAZEPINE ER 100 MG PO TB12
100.0000 mg | ORAL_TABLET | Freq: Two times a day (BID) | ORAL | Status: DC
  Administered 2015-04-24 – 2015-04-29 (×11): 100 mg via ORAL
  Filled 2015-04-24 (×14): qty 1

## 2015-04-24 NOTE — [facility] Group Notes (Signed)
Adult Psychoeducational Group Note  Date:  04/24/2015 Time:  0900am  Group Topic/Focus:  Goals Group:   The focus of this group is to help patients establish daily goals to achieve during treatment and discuss how the patient can incorporate goal setting into their daily lives to aide in recovery. Orientation:   The focus of this group is to educate the patient on the purpose and policies of crisis stabilization and provide a format to answer questions about their admission.  The group details unit policies and expectations of patients while admitted.  Participation Level:  Did Not Attend  Ryan Choi 04/24/2015, 1:28 PM
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
## 2015-04-24 NOTE — [facility] Group Notes (Signed)
[facility] Group Notes:  (Counselor/Nursing/MHT/Case Management/Adjunct)  04/24/2015 1:15PM  Type of Therapy:  Group Therapy  Participation Level:  Active  Participation Quality:  Appropriate  Affect:  Flat  Cognitive:  Oriented  Insight:  Improving  Engagement in Group:  Limited  Engagement in Therapy:  Limited  Modes of Intervention:  Discussion, Exploration and Socialization  Summary of Progress/Problems: The topic for group was balance in life.  Pt participated in the discussion about when their life was in balance and out of balance and how this feels.  Pt discussed ways to get back in balance and short term goals they can work on to get where they want to be. Engaged throughout.  Talked about being unbalanced "mentally, physically and spiritually."  He admitted to allowing this to happen "through neglect and selfishness," and talked about his desire to get better.  Brought up the theme of helping others as a way of taking focus off of self, which for him can be helpful.  Also gave examples of things he learned in prison about creativity and staying productive.   [name] 04/24/2015 3:55 PM

## 2015-04-24 NOTE — BHH Suicide Risk Assessment (Signed)
[facility] Admission Suicide Risk Assessment   Nursing information obtained from:  Patient Demographic factors:  Male, Living alone, Unemployed, Low socioeconomic status Current Mental Status:  NA Loss Factors:  Financial problems / change in socioeconomic status Historical Factors:  Prior suicide attempts Risk Reduction Factors:  Positive therapeutic relationship Total Time spent with patient: 30 minutes Principal Problem: Schizoaffective disorder, bipolar type Diagnosis:   Patient Active Problem List   Diagnosis Date Noted  . HTN (hypertension) [I10] 04/24/2015  . Hyperlipidemia [E78.5] 04/24/2015  . Cocaine use disorder, severe, dependence [F14.20] 04/24/2015  . Alcohol use disorder, moderate, dependence [F10.20] 04/24/2015  . Cannabis use disorder, severe, dependence [F12.20] 04/24/2015  . Tobacco use disorder [Z72.0] 04/24/2015  . Schizoaffective disorder, bipolar type [F25.0] 04/23/2015  . ERECTILE DYSFUNCTION [F52.8] 06/05/2007     Continued Clinical Symptoms:    The "Alcohol Use Disorders Identification Test", Guidelines for Use in Primary Care, Second Edition.  World Pharmacologist [facility]). Score between 0-7:  no or low risk or alcohol related problems. Score between 8-15:  moderate risk of alcohol related problems. Score between 16-19:  high risk of alcohol related problems. Score 20 or above:  warrants further diagnostic evaluation for alcohol dependence and treatment.   CLINICAL FACTORS:   Alcohol/Substance Abuse/Dependencies Unstable or Poor Therapeutic Relationship Previous Psychiatric Diagnoses and Treatments   Psychiatric Specialty Exam: Physical Exam  ROS  Blood pressure 124/91, pulse 107, temperature 98.1 F (36.7 C), temperature source Oral, resp. rate 18, height 5\' 9"  (1.753 m), weight 80.173 kg (176 lb 12 oz).Body mass index is 26.09 kg/(m^2).                Please see H&P.                                          COGNITIVE  FEATURES THAT CONTRIBUTE TO RISK:  Closed-mindedness, Polarized thinking and Thought constriction (tunnel vision)    SUICIDE RISK:   Moderate:  Frequent suicidal ideation with limited intensity, and duration, some specificity in terms of plans, no associated intent, good self-control, limited dysphoria/symptomatology, some risk factors present, and identifiable protective factors, including available and accessible social support.  PLAN OF CARE: Please see H&P.   Medical Decision Making:  Review of Psycho-Social Stressors (1), Review or order clinical lab tests (1), Decision to obtain old records (1), Established Problem, Worsening (2), Review of Medication Regimen & Side Effects (2) and Review of New Medication or Change in Dosage (2)  I certify that inpatient services furnished can reasonably be expected to improve the patient's condition.   Farah Lepak md 04/24/2015, 3:37 PM

## 2015-04-24 NOTE — Progress Notes (Signed)
Patient with one episode of n/v.  Saltine crackers and fluid at bedside.  Declining any other intervention.  "I'm coming off cocaine.  I dont want anything right now".  Encouraged to inform staff of furthur episodes.

## 2015-04-24 NOTE — Progress Notes (Signed)
Pt is unsteady on feet, provided pt with walker and educated him on use, pt return demonstrated use of walker and is aware that he needs to use it when ambulating, MD notified.

## 2015-04-24 NOTE — Progress Notes (Signed)
D- Patient has been quiet and isolative to room.  He reports "poor" sleep and "fair" appetite. Also reports "low" energy. Reports multiple physical complaints on patient inventory sheet but has not asked this writer for prn medications or verbalized any discomfort.  Rates depression at 6,anxiety at 5 and hopelessness at 6.  Denies SI.  A- Support and encouragement offered.  Continue POC and evaluation of treatment goals.  Continue 15' checks for safety.  R- Safety maintained.

## 2015-04-24 NOTE — Tx Team (Signed)
Interdisciplinary Treatment Plan Update (Adult) Date: 04/24/2015   Date: 04/24/2015 1:26 PM  Progress in Treatment:  Attending groups: No Participating in groups: No Taking medication as prescribed: Yes  Tolerating medication: Yes  Family/Significant othe contact made: Yes with Pt's friend/payee Patient understands diagnosis: Yes Discussing patient identified problems/goals with staff: Yes  Medical problems stabilized or resolved: Yes  Denies suicidal/homicidal ideation: Yes Patient has not harmed self or Others: Yes   New problem(s) identified: None identified at this time.   Discharge Plan or Barriers: Pt requests referrals to [facility] and [facility]  Additional comments: n/a   Reason for Continuation of Hospitalization:  Depression Suicidal Ideation Medication stabilization Command hallucinations  Estimated length of stay: 3-5 days  Review of initial/current patient goals per problem list:   1.  Goal(s): Patient will participate in aftercare plan  Met:  No  Target date: .3-5 days from date of admission   As evidenced by: Patient will participate within aftercare plan AEB aftercare provider and housing plan at discharge being identified.   04/24/15: Pt is requesting referrals to residential treatment center. CSW making appropriate referral.  2.  Goal (s): Patient will exhibit decreased depressive symptoms and suicidal ideations.  Met:  No  Target date: 3-5 days from date of admission   As evidenced by: Patient will utilize self rating of depression at 3 or below and demonstrate decreased signs of depression or be deemed stable for discharge by MD. 04/24/15: Pt was admitted with symptoms of depression, rating 10/10. Pt continues to present with flat affect and depressive symptoms.  Pt will demonstrate decreased symptoms of depression and rate depression at 3/10 or lower prior to discharge. Denies SI currently  4.  Goal(s): Patient will demonstrate decreased  signs of withdrawal due to substance abuse  Met:  Yes  Target date: 3-5 days from date of admission   As evidenced by: Patient will produce a CIWA/COWS score of 0, have stable vitals signs, and no symptoms of withdrawal  04/24/15: Per Pt and chart review, no signs of withdrawal noted. 5.  Goal(s): Patient will demonstrate decreased signs of psychosis  . Met:  No . Target date: 3-5 days from date of admission  . As evidenced by: Patient will demonstrate decreased frequency of AVH or return to baseline function    -04/24/15: Pt continues to endorse command hallucinations.  Attendees:  Patient:    Family:    Physician: Dr. Shea Evans, MD  04/24/2015 1:26 PM  Nursing: Lars Pinks, RN Case manager  04/24/2015 1:26 PM  Clinical Social Worker Norman Clay, MSW 04/24/2015 1:26 PM  Other: Lucinda Dell, Beverly Sessions Liasion 04/24/2015 1:26 PM  Clinical:  Kirkland Hun, RN 04/24/2015 1:26 PM  Other: , RN Charge Nurse 04/24/2015 1:26 PM  Other:     Peri Maris, [place] MSW

## 2015-04-24 NOTE — Progress Notes (Signed)
D   Pt stayed in his room all evening   He complained of nausea and lightheadedness   Pt requested medication to help him sleep and received same  A   Verbal support given   Medications administered and effectiveness monitored    Q 15 min checks R   Pt safe at present
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
## 2015-04-24 NOTE — Progress Notes (Signed)
NUTRITION ASSESSMENT  Pt identified as at risk on the Malnutrition Screen Tool  INTERVENTION: 1. Educated patient on the importance of nutrition and encouraged intake of food and beverages. 2. Discussed weight goals. 3. Supplements: Continue Ensure Enlive po BID, each supplement provides 350 kcal and 20 grams of protein  NUTRITION DIAGNOSIS: Unintentional weight loss related to sub-optimal intake as evidenced by pt report.   Goal: Pt to meet >/= 90% of their estimated nutrition needs.  Monitor:  PO intake  Assessment:  Pt admitted with schizoaffective disorder with cocaine and ETOH use. No weight history per EPIC. Pt reports weight loss of 75 lb per RN note. Pt currently homeless. Suspect poor quality diet PTA. Pt has been ordered Ensure supplements per [facility] protocol.  Height: Ht Readings from Last 1 Encounters:  04/23/15 5\' 9"  (1.753 m)    Weight: Wt Readings from Last 1 Encounters:  04/23/15 176 lb 12 oz (80.173 kg)    Weight Hx: Wt Readings from Last 10 Encounters:  04/23/15 176 lb 12 oz (80.173 kg)    BMI:  Body mass index is 26.09 kg/(m^2). Pt meets criteria for overweight based on current BMI.  Estimated Nutritional Needs: Kcal: 25-30 kcal/kg Protein: > 1 gram protein/kg Fluid: 1 ml/kcal  Diet Order:   Pt is also offered choice of unit snacks mid-morning and mid-afternoon.  Pt is eating as desired.   Lab results and medications reviewed.   Clayton Bibles, MS, RD, LDN Pager: [phone] After Hours Pager: [phone]
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
## 2015-04-24 NOTE — BHH Suicide Risk Assessment (Signed)
[place] INPATIENT:  Family/Significant Other Suicide Prevention Education  Suicide Prevention Education:  Education Completed; Alen Bleacher, Pt's friend and POA [phone], has been identified by the patient as the family member/significant other with whom the patient will be residing, and identified as the person(s) who will aid the patient in the event of a mental health crisis (suicidal ideations/suicide attempt).  With written consent from the patient, the family member/significant other has been provided the following suicide prevention education, prior to the and/or following the discharge of the patient.  The suicide prevention education provided includes the following:  Suicide risk factors  Suicide prevention and interventions  National Suicide Hotline telephone number  [facility] assessment telephone number  [facility] Emergency Assistance [place] and/or Residential Mobile Crisis Unit telephone number  Request made of family/significant other to:  Remove weapons (e.g., guns, rifles, knives), all items previously/currently identified as safety concern.    Remove drugs/medications (over-the-counter, prescriptions, illicit drugs), all items previously/currently identified as a safety concern.  The family member/significant other verbalizes understanding of the suicide prevention education information provided.  The family member/significant other agrees to remove the items of safety concern listed above.  Bo Mcclintock 04/24/2015, 12:44 PM

## 2015-04-25 LAB — LIPID PANEL
CHOLESTEROL: 192 mg/dL (ref 0–200)
HDL: 41 mg/dL (ref >40)
LDL Cholesterol: 126 mg/dL — ABNORMAL HIGH (ref 0–99)
Total CHOL/HDL Ratio: 4.7 RATIO
Triglycerides: 123 mg/dL (ref <150)
VLDL: 25 mg/dL (ref 0–40)

## 2015-04-25 LAB — TSH: TSH: 2.846 u[IU]/mL (ref 0.350–4.500)

## 2015-04-25 MED ORDER — FLUPHENAZINE HCL 5 MG PO TABS
5.0000 mg | ORAL_TABLET | Freq: Every evening | ORAL | Status: DC
  Administered 2015-04-25 – 2015-04-26 (×2): 5 mg via ORAL
  Filled 2015-04-25 (×4): qty 1

## 2015-04-25 MED ORDER — ONDANSETRON 4 MG PO TBDP
8.0000 mg | ORAL_TABLET | Freq: Three times a day (TID) | ORAL | Status: DC | PRN
Start: 2015-04-25 — End: 2015-04-29

## 2015-04-25 MED ORDER — FAMOTIDINE 20 MG PO TABS
20.0000 mg | ORAL_TABLET | Freq: Two times a day (BID) | ORAL | Status: DC
  Administered 2015-04-25 – 2015-04-29 (×8): 20 mg via ORAL
  Filled 2015-04-25 (×13): qty 1

## 2015-04-25 MED ORDER — FLUPHENAZINE HCL 5 MG PO TABS
7.5000 mg | ORAL_TABLET | Freq: Every evening | ORAL | Status: DC
  Filled 2015-04-25: qty 1

## 2015-04-25 NOTE — BHH Group Notes (Signed)
[facility] LCSW Group Therapy  04/25/2015  1:05 PM  Type of Therapy:  Group therapy  Participation Level:  Active  Participation Quality:  Attentive  Affect:  Flat  Cognitive:  Oriented  Insight:  Limited  Engagement in Therapy:  Limited  Modes of Intervention:  Discussion, Socialization  Summary of Progress/Problems:  Chaplain was here to lead a group on themes of hope and courage. Invited.  Chose to not attend.  Came after 20 minutes.  "Hope is serenity and peace of mind.  Everything is a choice.  I need to make some good ones.  That started by me coming in here.  I guess I'm afraid of change.  I'm tired of bing fearful of the unknown.  Trying to take a risk." Ryan Choi 04/25/2015 1:52 PM

## 2015-04-25 NOTE — Progress Notes (Signed)
[place] Group Notes:  (Nursing/MHT/Case Management/Adjunct)  Date:  04/25/2015  Time:  9:41 PM  Type of Therapy:  Psychoeducational Skills  Participation Level:  Minimal  Participation Quality:  Attentive  Affect:  Depressed  Cognitive:  Lacking  Insight:  Improving  Engagement in Group:  Limited  Modes of Intervention:  Education  Summary of Progress/Problems: The patient admits to feeling depressed and that he remains in his bedroom since its a "comfort zone". He understands that he needs to isolate less in the future. As a theme for the day, his relapse prevention is to play chess.   Ryan Choi S 04/25/2015, 9:41 PM

## 2015-04-25 NOTE — Progress Notes (Signed)
D:  Per pt self inventory pt reports sleeping good, appetite fair, energy level niormal, ability to pay attention good, rates depression at a 3 out of 10, hopelessness at a 4 out of 10, anxiety at a 4 out of 10, denies SI/HI/AVH, goal today: "get better and rest"      A:  Emotional support provided, Encouraged pt to continue with treatment plan and attend all group activities, q15 min checks maintained for safety.  R:  Pt has been in the bed most of the day, instructed pt to use walker when ambulating due to him feeling dizzy and unsteady on his feet, however pt has been refusing to use the walker and continues to ambulate without it, educated pt on fall precautions/prevention measure, pt verbalized understanding, pt has been pleasant and cooperative with staff and other patients on the unit, pt did get out of bed this afternoon and went to afternoon social work group.

## 2015-04-25 NOTE — Progress Notes (Addendum)
Ryan Choi  04/25/2015 12:03 PM Ryan Choi  MRN:  [id number] Subjective: Patient states " I do not feel too good. I threw up last night and still feel pain in my stomach on and off."  Objective: Patient seen and chart reviewed.Discussed patient with treatment team. Ryan Choi is a 44 y.o. AA male who is single , on SSD , has a payee, was living at a [place] , but was brought to ED for SI. Per initial notes in EHR - patient is currently being followed by [facility] team . He reported his POA as Dustin Folks [phone], but reported he is his own guardian. Pt reported that he relapsed on cocaine several weeks ago and as having SI with plan to OD on morphine.  Pt per staff had an episode of vomiting last night. Pt today appears better, continues to have some abdominal pian, but improved and also feels dizzy on and off. Pt reports his mood being affected by his medical issues and the way he feel. Pt feels that he could have eaten a lot of "greasy food ' from the cafeteria which would contribute to his current situation. Pt reports AH as better. Continues to have sleep issues. Pt denies any other concerns. Staff to observe pt on the unit. Pt currently walks with the help of a walker 2/2 dizziness. VS reviewed, encourage PO fluids.      Principal Problem: Schizoaffective disorder, bipolar type Diagnosis:   Patient Active Problem List   Diagnosis Date Noted  . HTN (hypertension) [I10] 04/24/2015  . Hyperlipidemia [E78.5] 04/24/2015  . Cocaine use disorder, severe, dependence [F14.20] 04/24/2015  . Alcohol use disorder, moderate, dependence [F10.20] 04/24/2015  . Cannabis use disorder, severe, dependence [F12.20] 04/24/2015  . Tobacco use disorder [Z72.0] 04/24/2015  . Schizoaffective disorder, bipolar type [F25.0] 04/23/2015  . ERECTILE DYSFUNCTION [F52.8] 06/05/2007   Total Time spent with patient: 30 minutes   Past Medical History:  Past Medical  History  Diagnosis Date  . Schizo affective schizophrenia   . Hypertension   . Hypercholesteremia     Past Surgical History  Procedure Laterality Date  . Cholecystectomy     Family History:  Family History  Problem Relation Age of Onset  . Hypertension Mother   . Diabetes Mother   . Mental illness Neg Hx    Social History:  History  Alcohol Use  . 1.2 oz/week  . 2 Cans of beer per week     History  Drug Use  . Yes  . Special: Cocaine, Marijuana    Social History   Social History  . Marital Status: Single    Spouse Name: N/A  . Number of Children: N/A  . Years of Education: N/A   Social History Main Topics  . Smoking status: Current Every Day Smoker -- 0.50 packs/day    Types: Cigarettes  . Smokeless tobacco: None  . Alcohol Use: 1.2 oz/week    2 Cans of beer per week  . Drug Use: Yes    Special: Cocaine, Marijuana  . Sexual Activity: Not Asked   Other Topics Concern  . None   Social History Narrative   Additional History:    Sleep: Poor  Appetite:  Poor    Musculoskeletal: Strength & Muscle Tone: within normal limits Gait & Station: normal Patient leans: Front and since he walks with a walker   Psychiatric Specialty Exam: Physical Exam  Review of Systems  Gastrointestinal: Positive for  heartburn, nausea and abdominal pain.  Psychiatric/Behavioral: Positive for depression. The patient has insomnia.   All other systems reviewed and are negative.   Blood pressure 114/84, pulse 103, temperature 97.6 F (36.4 C), temperature source Oral, resp. rate 18, height 5\' 9"  (1.753 m), weight 80.173 kg (176 lb 12 oz).Body mass index is 26.09 kg/(m^2).  General Appearance: Disheveled  Eye Contact::  Minimal  Speech:  Normal Rate  Volume:  Decreased  Mood:  Anxious and Depressed  Affect:  Depressed  Thought Process:  Goal Directed  Orientation:  Full (Time, Place, and Person)  Thought Content:  Hallucinations: Auditory  Suicidal Thoughts:  No   Homicidal Thoughts:  No  Memory:  Immediate;   Fair Recent;   Fair Remote;   Fair  Judgement:  Impaired  Insight:  Shallow  Psychomotor Activity:  Decreased  Concentration:  Poor  Recall:  [employer] of Knowledge:Fair  Language: Fair  Akathisia:  No  Handed:  Right  AIMS (if indicated):     Assets:  Desire for Improvement  ADL's:  Intact  Cognition: WNL  Sleep:  Number of Hours: 6.75     Current Medications: Current Facility-Administered Medications  Medication Dose Route Frequency Provider Last Rate Last Dose  . amantadine (SYMMETREL) capsule 100 mg  100 mg Oral BID Delfin Gant, NP   100 mg at 04/25/15 0800  . carbamazepine (TEGRETOL XR) 12 hr tablet 100 mg  100 mg Oral BID Ursula Alert, MD   100 mg at 04/25/15 0800  . famotidine (PEPCID) tablet 20 mg  20 mg Oral BID Ursula Alert, MD   20 mg at 04/25/15 1012  . feeding supplement (ENSURE ENLIVE) (ENSURE ENLIVE) liquid 237 mL  237 mL Oral BID BM Nianna Igo, MD   237 mL at 04/25/15 0802  . fluPHENAZine (PROLIXIN) tablet 5 mg  5 mg Oral QPM Le Ferraz, MD      . hydrochlorothiazide (HYDRODIURIL) tablet 25 mg  25 mg Oral Daily Delfin Gant, NP   25 mg at 04/25/15 0800  . LORazepam (ATIVAN) tablet 1 mg  1 mg Oral Q4H PRN Ursula Alert, MD   1 mg at 04/24/15 2005  . naproxen (NAPROSYN) tablet 375 mg  375 mg Oral BID WC Norrine Ballester, MD   375 mg at 04/25/15 0800  . nicotine (NICODERM CQ - dosed in mg/24 hours) patch 21 mg  21 mg Transdermal Daily Eunice Oldaker, MD   21 mg at 04/25/15 0800  . ondansetron (ZOFRAN-ODT) disintegrating tablet 8 mg  8 mg Oral Q8H PRN Ursula Alert, MD      . zolpidem (AMBIEN) tablet 5 mg  5 mg Oral QHS Ursula Alert, MD   5 mg at 04/24/15 2232    Lab Results:  Results for orders placed or performed during the hospital encounter of 04/23/15 (from the past 48 hour(s))  TSH     Status: None   Collection Time: 04/25/15  6:45 AM  Result Value Ref Range   TSH 2.846 0.350 -  4.500 uIU/mL    Comment: Performed at [facility]  Lipid panel     Status: Abnormal   Collection Time: 04/25/15  6:45 AM  Result Value Ref Range   Cholesterol 192 0 - 200 mg/dL   Triglycerides 123 <150 mg/dL   HDL 41 >40 mg/dL   Total CHOL/HDL Ratio 4.7 RATIO   VLDL 25 0 - 40 mg/dL   LDL Cholesterol 126 (H) 0 - 99 mg/dL  Comment:        Total Cholesterol/HDL:CHD Risk Coronary Heart Disease Risk Table                     Men   Women  1/2 Average Risk   3.4   3.3  Average Risk       5.0   4.4  2 X Average Risk   9.6   7.1  3 X Average Risk  23.4   11.0        Use the calculated Patient Ratio above and the CHD Risk Table to determine the patient's CHD Risk.        ATP III CLASSIFICATION (LDL):  <100     mg/dL   Optimal  100-129  mg/dL   Near or Above                    Optimal  130-159  mg/dL   Borderline  160-189  mg/dL   High  >190     mg/dL   Very High Performed at [facility]     Physical Findings: AIMS: Facial and Oral Movements Muscles of Facial Expression: None, normal Lips and Perioral Area: None, normal Jaw: None, normal Tongue: None, normal,Extremity Movements Upper (arms, wrists, hands, fingers): None, normal Lower (legs, knees, ankles, toes): None, normal, Trunk Movements Neck, shoulders, hips: None, normal, Overall Severity Severity of abnormal movements (highest score from questions above): None, normal Incapacitation due to abnormal movements: None, normal Patient's awareness of abnormal movements (rate only patient's report): No Awareness, Dental Status Current problems with teeth and/or dentures?: No Does patient usually wear dentures?: No  CIWA:  CIWA-Ar Total: 9 COWS:      Assessment: Ryan Choi is a 44 y.o. AA male who is single , on SSD , has a payee, was living at a [place] , but was brought to ED for SI. Per initial notes in EHR - patient is currently being followed by [facility] team . Will  encourage po fluids - monitor patient on the unit. Will not make any medication changes tonight due to his nausea .    Treatment Plan Summary: Daily contact with patient to assess and evaluate symptoms and progress in treatment and Medication management  Will continue Prolixin 5 mg po daily for psychosis . Will not increase today due to his vomiting/nausea .Pt if tolerates it well can be discharged on Prolixin dec. Will continue Tegretol xr 100 mg po bid for mood lability. Will continue Ambien 5 mg po qhs for sleep. Will start Pepcid 20 mg po bid for GI issues, make  Available Zofran 8 mg po prn for N/V. Start [employer]. Encourage pO fluids. Start Fall precaution 2/2 dizziness. Restart all home meds where indicated. TSH ,lipid panel reviewed- wnl. Will continue to monitor vitals ,medication compliance and treatment side effects while patient is here.  Will monitor for medical issues as well as call consult as needed.  CSW will start working on disposition.  Patient to participate in therapeutic milieu .  Medical Decision Making:  Review of Psycho-Social Stressors (1), Review or order clinical lab tests (1), New Problem, with no additional work-up planned (3), Review of Last Therapy Session (1), Review of Medication Regimen & Side Effects (2) and Review of New Medication or Change in Dosage (2)     Nakota Ackert MD 04/25/2015, 12:03 PM

## 2015-04-25 NOTE — Progress Notes (Signed)
D    Pt has been out of his room and doing some socialization with others   He said the food he ate at dinner gave him diarrhea but he denies having a bowel movement   Pt seems limited and has difficulty answering questions   He is pleasant and smiles a lot     A   Verbal support given   Medications administered and effectiveness monitored   Q 15 min checks R   Pt safe at present

## 2015-04-26 DIAGNOSIS — E221 Hyperprolactinemia: Secondary | ICD-10-CM | POA: Clinically undetermined

## 2015-04-26 LAB — PROLACTIN: PROLACTIN: 19.3 ng/mL — AB (ref 4.0–15.2)

## 2015-04-26 LAB — HEMOGLOBIN A1C
HEMOGLOBIN A1C: 5.4 % (ref 4.8–5.6)
MEAN PLASMA GLUCOSE: 108 mg/dL

## 2015-04-26 MED ORDER — MIRTAZAPINE 7.5 MG PO TABS
7.5000 mg | ORAL_TABLET | Freq: Every day | ORAL | Status: DC
  Administered 2015-04-26 – 2015-04-28 (×3): 7.5 mg via ORAL
  Filled 2015-04-26: qty 1
  Filled 2015-04-26: qty 3
  Filled 2015-04-26 (×4): qty 1

## 2015-04-26 NOTE — Progress Notes (Signed)
D: Pt denies SI/HI/AVH. Pt is pleasant and cooperative. Pt continues to use walker. Pt gait remains un-steady at times. Pt has flat blunted affect, but pt brightens upon approach. Pt stated he wants a LT Tx facility.   A: Pt was offered support and encouragement. Pt was given scheduled medications. Pt was encourage to attend groups. Q 15 minute checks were done for safety.  R:Pt attends groups and interacts well with peers and staff. Pt is taking medication. Pt has no complaints at this time .Pt receptive to treatment and safety maintained on unit.

## 2015-04-26 NOTE — [facility] Group Notes (Signed)
[facility] LCSW Group Therapy  04/26/2015   11:15 AM  Type of Therapy:  Group Therapy  Participation Level:  Did Not Attend, despite encouragement of CSW pt choose to take a shower instead of attending group. b   Lyla Glassing 04/26/2015

## 2015-04-26 NOTE — Plan of Care (Signed)
Problem: Alteration in mood Goal: LTG-Patient reports reduction in suicidal thoughts (Patient reports reduction in suicidal thoughts and is able to verbalize a safety plan for whenever patient is feeling suicidal)  Outcome: Progressing Pt denies SI at this time   Problem: Alteration in thought process Goal: LTG-Patient behavior demonstrates decreased signs psychosis (Patient behavior demonstrates decreased signs of psychosis to the point the patient is safe to return home and continue treatment in an outpatient setting.)  Outcome: Progressing Pt observed on the unit and does not appear to be responding to internal stimuli and denies AVH at this time

## 2015-04-26 NOTE — Progress Notes (Signed)
[facility] MD Progress Note  04/26/2015 4:04 PM Ryan Choi  MRN:  [id number] Subjective: Patient states " I did not sleep well last night.'    Objective: Patient seen and chart reviewed.Discussed patient with treatment team. Ryan Choi is a 44 y.o. AA male who is single , on SSD , has a payee, was living at a [place] , but was brought to ED for SI. Per initial notes in EHR - patient is currently being followed by [facility] team . He reported his POA as Dustin Folks [phone], but reported he is his own guardian. Pt reported that he relapsed on cocaine several weeks ago and as having SI with plan to OD on morphine.  Pt today reports sleep issues. Pt otherwise with no other complaints . He did not have any more GI issues . His appetite is fair. Discussed changing his sleep medication to Remeron- he agrees with plan.   Principal Problem: Schizoaffective disorder, bipolar type Diagnosis:   Patient Active Problem List   Diagnosis Date Noted  . HTN (hypertension) [I10] 04/24/2015  . Hyperlipidemia [E78.5] 04/24/2015  . Cocaine use disorder, severe, dependence [F14.20] 04/24/2015  . Alcohol use disorder, moderate, dependence [F10.20] 04/24/2015  . Cannabis use disorder, severe, dependence [F12.20] 04/24/2015  . Tobacco use disorder [Z72.0] 04/24/2015  . Schizoaffective disorder, bipolar type [F25.0] 04/23/2015  . ERECTILE DYSFUNCTION [F52.8] 06/05/2007   Total Time spent with patient: 30 minutes   Past Medical History:  Past Medical History  Diagnosis Date  . Schizo affective schizophrenia   . Hypertension   . Hypercholesteremia     Past Surgical History  Procedure Laterality Date  . Cholecystectomy     Family History:  Family History  Problem Relation Age of Onset  . Hypertension Mother   . Diabetes Mother   . Mental illness Neg Hx    Social History:  History  Alcohol Use  . 1.2 oz/week  . 2 Cans of beer per week     History  Drug Use  .  Yes  . Special: Cocaine, Marijuana    Social History   Social History  . Marital Status: Single    Spouse Name: N/A  . Number of Children: N/A  . Years of Education: N/A   Social History Main Topics  . Smoking status: Current Every Day Smoker -- 0.50 packs/day    Types: Cigarettes  . Smokeless tobacco: None  . Alcohol Use: 1.2 oz/week    2 Cans of beer per week  . Drug Use: Yes    Special: Cocaine, Marijuana  . Sexual Activity: Not Asked   Other Topics Concern  . None   Social History Narrative   Additional History:    Sleep: Poor  Appetite:  Fair    Musculoskeletal: Strength & Muscle Tone: within normal limits Gait & Station: normal Patient leans: Front and since he walks with a walker   Psychiatric Specialty Exam: Physical Exam  Review of Systems  Gastrointestinal: Negative for heartburn, nausea and abdominal pain.  Psychiatric/Behavioral: Positive for depression. The patient has insomnia.   All other systems reviewed and are negative.   Blood pressure 117/80, pulse 77, temperature 97.5 F (36.4 C), temperature source Oral, resp. rate 18, height 5\' 9"  (1.753 m), weight 80.173 kg (176 lb 12 oz).Body mass index is 26.09 kg/(m^2).  General Appearance: Disheveled  Eye Contact::  Minimal  Speech:  Normal Rate  Volume:  Decreased  Mood:  Anxious and Depressed  Affect:  Depressed  Thought Process:  Goal Directed  Orientation:  Full (Time, Place, and Person)  Thought Content:  Hallucinations: Auditory less  Suicidal Thoughts:  No  Homicidal Thoughts:  No  Memory:  Immediate;   Fair Recent;   Fair Remote;   Fair  Judgement:  Impaired  Insight:  Shallow  Psychomotor Activity:  Decreased  Concentration:  Poor  Recall:  [employer] of Knowledge:Fair  Language: Fair  Akathisia:  No  Handed:  Right  AIMS (if indicated):     Assets:  Desire for Improvement  ADL's:  Intact  Cognition: WNL  Sleep:  Number of Hours: 6.75     Current Medications: Current  Facility-Administered Medications  Medication Dose Route Frequency Provider Last Rate Last Dose  . amantadine (SYMMETREL) capsule 100 mg  100 mg Oral BID Delfin Gant, NP   100 mg at 04/26/15 0737  . carbamazepine (TEGRETOL XR) 12 hr tablet 100 mg  100 mg Oral BID Ursula Alert, MD   100 mg at 04/26/15 0737  . famotidine (PEPCID) tablet 20 mg  20 mg Oral BID Ursula Alert, MD   20 mg at 04/25/15 1625  . feeding supplement (ENSURE ENLIVE) (ENSURE ENLIVE) liquid 237 mL  237 mL Oral BID BM Clevie Prout, MD   237 mL at 04/26/15 1417  . fluPHENAZine (PROLIXIN) tablet 5 mg  5 mg Oral QPM Ursula Alert, MD   5 mg at 04/25/15 1909  . hydrochlorothiazide (HYDRODIURIL) tablet 25 mg  25 mg Oral Daily Delfin Gant, NP   25 mg at 04/26/15 0737  . LORazepam (ATIVAN) tablet 1 mg  1 mg Oral Q4H PRN Ursula Alert, MD   1 mg at 04/26/15 1416  . mirtazapine (REMERON) tablet 7.5 mg  7.5 mg Oral QHS Goerge Mohr, MD      . naproxen (NAPROSYN) tablet 375 mg  375 mg Oral BID WC Fernando Torry, MD   375 mg at 04/26/15 0737  . nicotine (NICODERM CQ - dosed in mg/24 hours) patch 21 mg  21 mg Transdermal Daily Ursula Alert, MD   21 mg at 04/26/15 0737  . ondansetron (ZOFRAN-ODT) disintegrating tablet 8 mg  8 mg Oral Q8H PRN Ursula Alert, MD        Lab Results:  Results for orders placed or performed during the hospital encounter of 04/23/15 (from the past 48 hour(s))  TSH     Status: None   Collection Time: 04/25/15  6:45 AM  Result Value Ref Range   TSH 2.846 0.350 - 4.500 uIU/mL    Comment: Performed at [facility]  Lipid panel     Status: Abnormal   Collection Time: 04/25/15  6:45 AM  Result Value Ref Range   Cholesterol 192 0 - 200 mg/dL   Triglycerides 123 <150 mg/dL   HDL 41 >40 mg/dL   Total CHOL/HDL Ratio 4.7 RATIO   VLDL 25 0 - 40 mg/dL   LDL Cholesterol 126 (H) 0 - 99 mg/dL    Comment:        Total Cholesterol/HDL:CHD Risk Coronary Heart Disease Risk Table                      Men   Women  1/2 Average Risk   3.4   3.3  Average Risk       5.0   4.4  2 X Average Risk   9.6   7.1  3 X Average Risk  23.4  11.0        Use the calculated Patient Ratio above and the CHD Risk Table to determine the patient's CHD Risk.        ATP III CLASSIFICATION (LDL):  <100     mg/dL   Optimal  100-129  mg/dL   Near or Above                    Optimal  130-159  mg/dL   Borderline  160-189  mg/dL   High  >190     mg/dL   Very High Performed at [facility]   Hemoglobin A1c     Status: None   Collection Time: 04/25/15  6:45 AM  Result Value Ref Range   Hgb A1c MFr Bld 5.4 4.8 - 5.6 %    Comment: (NOTE)         Pre-diabetes: 5.7 - 6.4         Diabetes: >6.4         Glycemic control for adults with diabetes: <7.0    Mean Plasma Glucose 108 mg/dL    Comment: (NOTE) Performed At: [facility] [place], [place] [id number] Lindon Romp MD [id number] Performed at [facility]   Prolactin     Status: Abnormal   Collection Time: 04/25/15  6:45 AM  Result Value Ref Range   Prolactin 19.3 (H) 4.0 - 15.2 ng/mL    Comment: (NOTE) Performed At: [facility] [place], [place] [id number] Lindon Romp MD [id number] Performed at [facility]     Physical Findings: AIMS: Facial and Oral Movements Muscles of Facial Expression: None, normal Lips and Perioral Area: None, normal Jaw: None, normal Tongue: None, normal,Extremity Movements Upper (arms, wrists, hands, fingers): None, normal Lower (legs, knees, ankles, toes): None, normal, Trunk Movements Neck, shoulders, hips: None, normal, Overall Severity Severity of abnormal movements (highest score from questions above): None, normal Incapacitation due to abnormal movements: None, normal Patient's awareness of abnormal movements (rate only patient's report): No Awareness, Dental Status Current problems with  teeth and/or dentures?: No Does patient usually wear dentures?: No  CIWA:  CIWA-Ar Total: 0 COWS:      Assessment: KYLO GAVIN is a 44 y.o. AA male who is single , on SSD , has a payee, was living at a [place] , but was brought to ED for SI. Per initial notes in EHR - patient is currently being followed by [facility] team . Pt has sleep issues. Will continue treatment .    Treatment Plan Summary: Daily contact with patient to assess and evaluate symptoms and progress in treatment and Medication management  Will continue Prolixin 5 mg po daily for psychosis .Pt if tolerates it well can be discharged on Prolixin dec. Will continue Tegretol xr 100 mg po bid for mood lability. Will DC Ambien. Will start Remeron 7.5 mg po qhs for sleep. Prolactin elevated - to be monitored on an out pt basis. Will continue to monitor vitals ,medication compliance and treatment side effects while patient is here.  Will monitor for medical issues as well as call consult as needed.  CSW will start working on disposition.  Patient to participate in therapeutic milieu .  Medical Decision Making:  Review of Psycho-Social Stressors (1), Review or order clinical lab tests (1), New Problem, with no additional work-up planned (3), Review of Last Therapy Session (1), Review of Medication Regimen &  Side Effects (2) and Review of New Medication or Change in Dosage (2)     Kaydie Petsch MD 04/26/2015, 4:04 PM

## 2015-04-26 NOTE — Progress Notes (Signed)
Morning Wellness Group 0900  Patient did not attend  The focus of this group is to educate the patient on the purpose and policies of crisis stabilization and provide a format to answer questions about their admission.  The group details unit policies and expectations of patients while admitted.

## 2015-04-26 NOTE — Progress Notes (Signed)
D: Patient denies SI/HI and visual hallucinations. Patient reports having auditory hallucinations and reports hearing "voices" but states that they have "gotten a bit quieter." The patient has a depressed mood and affect. The patient is participating within the milieu and is cooperative with staff and peers. Patient reports that he felt disorganized this morning and reportedly was visualizing drugs and the patient is unsure if he was dreaming or having hallucinations.    A: Patient given emotional support from RN. Patient encouraged to come to staff with concerns and/or questions. Patient's medication routine continued. Patient's orders and plan of care reviewed.  R: Patient remains appropriate and cooperative. Will continue to monitor patient q15 minutes for safety.

## 2015-04-26 NOTE — Progress Notes (Signed)
[facility] Group Notes:  (Nursing/MHT/Case Management/Adjunct)  Date:  04/26/2015  Time:  9:01 PM  Type of Therapy:  Psychoeducational Skills  Participation Level:  Active  Participation Quality:  Attentive  Affect:  Blunted  Cognitive:  Appropriate  Insight:  Improving  Engagement in Group:  Improving  Modes of Intervention:  Education  Summary of Progress/Problems: Patient stated in group that he was proud of the fact that he remained awake for much of the day. He admits to having napped briefly but less so than yesterday. In terms of the theme for the day, his support system will be made up of his roommate.   Ryan Choi 04/26/2015, 9:01 PM

## 2015-04-27 MED ORDER — FLUPHENAZINE DECANOATE 25 MG/ML IJ SOLN
12.5000 mg | INTRAMUSCULAR | Status: DC
  Administered 2015-04-27: 12.5 mg via INTRAMUSCULAR
  Filled 2015-04-27: qty 0.5

## 2015-04-27 MED ORDER — FLUPHENAZINE HCL 5 MG PO TABS
5.0000 mg | ORAL_TABLET | Freq: Every evening | ORAL | Status: DC
  Administered 2015-04-27 – 2015-04-28 (×2): 5 mg via ORAL
  Filled 2015-04-27: qty 1
  Filled 2015-04-27: qty 3
  Filled 2015-04-27 (×2): qty 1

## 2015-04-27 MED ORDER — FLUPHENAZINE HCL 10 MG PO TABS: 10.0000 mg | ORAL_TABLET | Freq: Every evening | ORAL | Status: DC

## 2015-04-27 NOTE — [facility] Group Notes (Signed)
[facility] Group Notes: (Clinical Social Work)   04/27/2015      Type of Therapy:  Group Therapy   Participation Level:  Did Not Attend despite MHT prompting   Selmer Dominion, LCSW 04/27/2015, 5:49 PM

## 2015-04-27 NOTE — Progress Notes (Signed)
Adult Psychoeducational Group Note  Date:  04/27/2015 Time:  9:55 PM  Group Topic/Focus:  Wrap-Up Group:   The focus of this group is to help patients review their daily goal of treatment and discuss progress on daily workbooks.  Participation Level:  Active  Participation Quality:  Appropriate  Affect:  Appropriate  Cognitive:  Appropriate  Insight: Appropriate  Engagement in Group:  Engaged  Modes of Intervention:  Discussion  Additional Comments: The patient expressed that he attended group.  Ryan Choi 04/27/2015, 9:55 PM

## 2015-04-27 NOTE — Progress Notes (Signed)
[facility] MD Progress Note  04/27/2015 12:37 PM Ryan Choi  MRN:  [id number] Subjective: Patient states " I am OK.'    Objective: Patient seen and chart reviewed.Discussed patient with treatment team. Ryan Choi is a 44 y.o. AA male who is single , on SSD , has a payee, was living at a [place] , but was brought to ED for SI. Per initial notes in EHR - patient is currently being followed by [facility] ACT team . He reported his POA as Dustin Folks [phone], but reported he is his own guardian. Pt reported that he relapsed on cocaine several weeks ago and as having SI with plan to OD on morphine.  Pt today with improvement of sleep issues , but appears guarded .Pt otherwise with no other complaints . Pt denies SI/HI- AH is improving than admission. Pt tolerating medications well.  Staff reports no disruptive issues on the unit.   Principal Problem: Schizoaffective disorder, bipolar type Diagnosis:   Patient Active Problem List   Diagnosis Date Noted  . Hyperprolactinemia [E22.1] 04/26/2015  . HTN (hypertension) [I10] 04/24/2015  . Hyperlipidemia [E78.5] 04/24/2015  . Cocaine use disorder, severe, dependence [F14.20] 04/24/2015  . Alcohol use disorder, moderate, dependence [F10.20] 04/24/2015  . Cannabis use disorder, severe, dependence [F12.20] 04/24/2015  . Tobacco use disorder [Z72.0] 04/24/2015  . Schizoaffective disorder, bipolar type [F25.0] 04/23/2015  . ERECTILE DYSFUNCTION [F52.8] 06/05/2007   Total Time spent with patient: 25 minutes   Past Medical History:  Past Medical History  Diagnosis Date  . Schizo affective schizophrenia   . Hypertension   . Hypercholesteremia     Past Surgical History  Procedure Laterality Date  . Cholecystectomy     Family History:  Family History  Problem Relation Age of Onset  . Hypertension Mother   . Diabetes Mother   . Mental illness Neg Hx    Social History:  History  Alcohol Use  . 1.2 oz/week  . 2  Cans of beer per week     History  Drug Use  . Yes  . Special: Cocaine, Marijuana    Social History   Social History  . Marital Status: Single    Spouse Name: N/A  . Number of Children: N/A  . Years of Education: N/A   Social History Main Topics  . Smoking status: Current Every Day Smoker -- 0.50 packs/day    Types: Cigarettes  . Smokeless tobacco: None  . Alcohol Use: 1.2 oz/week    2 Cans of beer per week  . Drug Use: Yes    Special: Cocaine, Marijuana  . Sexual Activity: Not Asked   Other Topics Concern  . None   Social History Narrative   Additional History:    Sleep: Fair  Appetite:  Fair    Musculoskeletal: Strength & Muscle Tone: within normal limits Gait & Station: normal Patient leans: Front and since he walks with a walker   Psychiatric Specialty Exam: Physical Exam  Review of Systems  Gastrointestinal: Negative for heartburn, nausea and abdominal pain.  Psychiatric/Behavioral: Positive for depression and hallucinations. The patient has insomnia.   All other systems reviewed and are negative.   Blood pressure 117/80, pulse 77, temperature 97.5 F (36.4 C), temperature source Oral, resp. rate 18, height 5\' 9"  (1.753 m), weight 80.173 kg (176 lb 12 oz).Body mass index is 26.09 kg/(m^2).  General Appearance: Disheveled  Eye Contact::  Minimal  Speech:  Normal Rate  Volume:  Decreased  Mood:  Anxious and Depressed  Affect:  Depressed  Thought Process:  Goal Directed  Orientation:  Full (Time, Place, and Person)  Thought Content:  Hallucinations: Auditory less  Suicidal Thoughts:  No  Homicidal Thoughts:  No  Memory:  Immediate;   Fair Recent;   Fair Remote;   Fair  Judgement:  Impaired  Insight:  Shallow  Psychomotor Activity:  Decreased  Concentration:  Poor  Recall:  [employer] of Knowledge:Fair  Language: Fair  Akathisia:  No  Handed:  Right  AIMS (if indicated):     Assets:  Desire for Improvement  ADL's:  Intact  Cognition: WNL   Sleep:  Number of Hours: 6.75     Current Medications: Current Facility-Administered Medications  Medication Dose Route Frequency Provider Last Rate Last Dose  . amantadine (SYMMETREL) capsule 100 mg  100 mg Oral BID Delfin Gant, NP   100 mg at 04/27/15 0808  . carbamazepine (TEGRETOL XR) 12 hr tablet 100 mg  100 mg Oral BID Ursula Alert, MD   100 mg at 04/27/15 0808  . famotidine (PEPCID) tablet 20 mg  20 mg Oral BID Ursula Alert, MD   20 mg at 04/27/15 0808  . feeding supplement (ENSURE ENLIVE) (ENSURE ENLIVE) liquid 237 mL  237 mL Oral BID BM Finesse Fielder, MD   237 mL at 04/27/15 0813  . fluPHENAZine (PROLIXIN) tablet 5 mg  5 mg Oral QPM Derral Colucci, MD      . fluPHENAZine decanoate (PROLIXIN) injection 12.5 mg  12.5 mg Intramuscular Q14 Days Tywan Siever, MD      . hydrochlorothiazide (HYDRODIURIL) tablet 25 mg  25 mg Oral Daily Delfin Gant, NP   25 mg at 04/27/15 0808  . LORazepam (ATIVAN) tablet 1 mg  1 mg Oral Q4H PRN Ursula Alert, MD   1 mg at 04/26/15 2119  . mirtazapine (REMERON) tablet 7.5 mg  7.5 mg Oral QHS Diyan Dave, MD   7.5 mg at 04/26/15 2119  . naproxen (NAPROSYN) tablet 375 mg  375 mg Oral BID WC Ursula Alert, MD   375 mg at 04/27/15 0808  . nicotine (NICODERM CQ - dosed in mg/24 hours) patch 21 mg  21 mg Transdermal Daily Ursula Alert, MD   21 mg at 04/27/15 0809  . ondansetron (ZOFRAN-ODT) disintegrating tablet 8 mg  8 mg Oral Q8H PRN Ursula Alert, MD        Lab Results:  No results found for this or any previous visit (from the past 48 hour(s)).  Physical Findings: AIMS: Facial and Oral Movements Muscles of Facial Expression: None, normal Lips and Perioral Area: None, normal Jaw: None, normal Tongue: None, normal,Extremity Movements Upper (arms, wrists, hands, fingers): None, normal Lower (legs, knees, ankles, toes): None, normal, Trunk Movements Neck, shoulders, hips: None, normal, Overall Severity Severity of abnormal  movements (highest score from questions above): None, normal Incapacitation due to abnormal movements: None, normal Patient's awareness of abnormal movements (rate only patient's report): No Awareness, Dental Status Current problems with teeth and/or dentures?: No Does patient usually wear dentures?: No  CIWA:  CIWA-Ar Total: 0 COWS:      Assessment: Ryan Choi is a 44 y.o. AA male who is single , on SSD , has a payee, was living at a [place] , but was brought to ED for SI. Per initial notes in EHR - patient is currently being followed by [facility] ACT team . Pt has sleep issues. Will continue treatment .  Treatment Plan Summary: Daily contact with patient to assess and evaluate symptoms and progress in treatment and Medication management  Will continue Prolixin 5 mg po daily for psychosis .Prolixin decanoate 12.5 mg IM today .repeat q14days. Will continue Tegretol xr 100 mg po bid for mood lability. Will continue  Remeron 7.5 mg po qhs for sleep. Prolactin elevated - to be monitored on an out pt basis. Will continue to monitor vitals ,medication compliance and treatment side effects while patient is here.  Will monitor for medical issues as well as call consult as needed.  CSW will start working on disposition.  Patient to participate in therapeutic milieu .    Patient if stable to be discharged tomorrow.    Medical Decision Making:  Review of Psycho-Social Stressors (1), Review or order clinical lab tests (1), Review of Last Therapy Session (1), Review of Medication Regimen & Side Effects (2) and Review of New Medication or Change in Dosage (2)     Ryan Meir MD 04/27/2015, 12:37 PM

## 2015-04-27 NOTE — [facility] Group Notes (Signed)
[facility] Group Notes:  (Nursing/MHT/Case Management/Adjunct)  Date:  04/27/2015  Time:  11:58 AM  Type of Therapy:  Psychoeducational Skills  Participation Level:  Did Not Attend  Participation Quality:  Did Not Attend  Affect:  Did Not Attend  Cognitive:  Did Not Attend  Insight:  None  Engagement in Group:  Did Not Attend  Modes of Intervention:  Did Not Attend  Summary of Progress/Problems: Pt did not attend patient self inventory group.   Benancio Deeds Shanta 04/27/2015, 11:58 AM

## 2015-04-27 NOTE — Progress Notes (Signed)
Patient ID: Ryan Choi, male   DOB: 1971-08-18, 44 y.o.   MRN: [id number] D:Pt denies SI/HI/AVH. Pt Pt continues to use walker.  Pt has flat blunted affect, but pt brightens upon approach.   A: Patient given emotional support from RN. Patient given medications per MD orders. Prolixin injection tolerated well. Patient encouraged to attend groups and unit activities. Patient encouraged to come to staff with any questions or concerns.  R: Patient remains cooperative and appropriate. Will continue to monitor patient for safety.

## 2015-04-28 NOTE — Plan of Care (Signed)
Problem: Alteration in mood Goal: LTG-Patient reports reduction in suicidal thoughts (Patient reports reduction in suicidal thoughts and is able to verbalize a safety plan for whenever patient is feeling suicidal)  Outcome: Progressing Denies  SI at this time   Problem: Diagnosis: Increased Risk For Suicide Attempt Goal: LTG-Patient Will Report Improved Mood and Deny Suicidal LTG (by discharge) Patient will report improved mood and deny suicidal ideation.  Outcome: Progressing Pt stated he was feeling a little better and denied SI at this time.

## 2015-04-28 NOTE — [facility] Group Notes (Signed)
[facility] LCSW Group Therapy  04/28/2015 1:15 pm  Type of Therapy: Process Group Therapy  Participation Level:  Active  Participation Quality:  Appropriate  Affect:  Flat  Cognitive:  Oriented  Insight:  Improving  Engagement in Group:  Limited  Engagement in Therapy:  Limited  Modes of Intervention:  Activity, Clarification, Education, Problem-solving and Support  Summary of Progress/Problems: Today's group addressed the issue of overcoming obstacles.  Patients were asked to identify their biggest obstacle post d/c that stands in the way of their on-going success, and then problem solve as to how to manage this. Stayed the entire time.  Gave a heart felt speech about his history of making poor choices, his unrlelenting cravings for cocaine, how he saw himself heading for prison prior to admission here, how he lives in a "crack house" and how he is serious about getting into rehab from here, "even if it means going to that hell hole [facility].  I mean, I guess that's a bit harsh since that's what I am living in now."  Ryan Choi 04/28/2015   3:55 PM

## 2015-04-28 NOTE — Progress Notes (Cosign Needed)
D) Pt has been blunted, sad, flat and depressed. Pt is cooperative on approach and has in active in the milieu. Positive for groups and activities with minimal prompting. Psychomotor retardation evident. Pt continues to use walker stating "I just get up too fast". Ryan Choi endorses auditory hallucinations not command in nature. Initially ambivalent when asked although did acknowledge he hears mumbling as "background noise". A) level 3 obs for safety, walker in place, support and encouragement provided. Med ed reinforced. Positive reinforcement provided. R) Cooperative. Safety maintained.

## 2015-04-28 NOTE — Progress Notes (Signed)
Patient ID: Ryan Choi, male   DOB: 05/10/1971, 44 y.o.   MRN: [id number] [facility] MD Progress Note  04/28/2015 2:00 PM LOTUS GOVER  MRN:  [id number]  Subjective: Ryan Choi reports, " I have been craving cocaine so bad that I bought some from here & kept it on my night stand. Then, I looked for it to use, could not find it. I then realize I was dreaming it. That is how bad I'm craving it. I'm still feeling depressed & hopeless"  Objective: Patient seen and chart reviewed. Discussed patient with treatment team. Ryan Choi is visible on the unit. He is participating in group sessions. He ambulates with the aid of a walker due to lower extremity weakness. He also says when he walks without a walker, his balance is off. He continue to niof symptoms of depression. He expressed his interest in going to a substance abuse treatment center for further treatment after discharge. He denies any SIHI, AVH. He complains of fellow patient Vaughan Basta beging him & other patients for money.  Principal Problem: Schizoaffective disorder, bipolar type Diagnosis:   Patient Active Problem List   Diagnosis Date Noted  . Hyperprolactinemia [E22.1] 04/26/2015  . HTN (hypertension) [I10] 04/24/2015  . Hyperlipidemia [E78.5] 04/24/2015  . Cocaine use disorder, severe, dependence [F14.20] 04/24/2015  . Alcohol use disorder, moderate, dependence [F10.20] 04/24/2015  . Cannabis use disorder, severe, dependence [F12.20] 04/24/2015  . Tobacco use disorder [Z72.0] 04/24/2015  . Schizoaffective disorder, bipolar type [F25.0] 04/23/2015  . ERECTILE DYSFUNCTION [F52.8] 06/05/2007   Total Time spent with patient: 15 minutes  Past Medical History:  Past Medical History  Diagnosis Date  . Schizo affective schizophrenia   . Hypertension   . Hypercholesteremia     Past Surgical History  Procedure Laterality Date  . Cholecystectomy     Family History:  Family History  Problem Relation Age of Onset  . Hypertension Mother   .  Diabetes Mother   . Mental illness Neg Hx    Social History:  History  Alcohol Use  . 1.2 oz/week  . 2 Cans of beer per week     History  Drug Use  . Yes  . Special: Cocaine, Marijuana    Social History   Social History  . Marital Status: Single    Spouse Name: N/A  . Number of Children: N/A  . Years of Education: N/A   Social History Main Topics  . Smoking status: Current Every Day Smoker -- 0.50 packs/day    Types: Cigarettes  . Smokeless tobacco: None  . Alcohol Use: 1.2 oz/week    2 Cans of beer per week  . Drug Use: Yes    Special: Cocaine, Marijuana  . Sexual Activity: Not Asked   Other Topics Concern  . None   Social History Narrative   Additional History:    Sleep: Fair  Appetite:  Fair  Musculoskeletal: Strength & Muscle Tone: within normal limits Gait & Station: normal, unsteady Patient leans: Front and since he walks with a walker  Psychiatric Specialty Exam: Physical Exam  Review of Systems  Gastrointestinal: Negative for heartburn, nausea and abdominal pain.  Psychiatric/Behavioral: Positive for depression and hallucinations. The patient has insomnia.   All other systems reviewed and are negative.   Blood pressure 140/95, pulse 80, temperature 97.7 F (36.5 C), temperature source Oral, resp. rate 20, height 5\' 9"  (1.753 m), weight 80.173 kg (176 lb 12 oz).Body mass index is 26.09 kg/(m^2).  General Appearance: [employer]  Contact::  Minimal  Speech:  Normal Rate  Volume:  Decreased  Mood:  Anxious and Depressed  Affect:  Depressed  Thought Process:  Goal Directed  Orientation:  Full (Time, Place, and Person)  Thought Content:  Hallucinations: Auditory less  Suicidal Thoughts:  No  Homicidal Thoughts:  No  Memory:  Immediate;   Fair Recent;   Fair Remote;   Fair  Judgement:  Impaired  Insight:  Shallow  Psychomotor Activity:  Decreased  Concentration:  Poor  Recall:  [employer] of Knowledge:Fair  Language: Fair  Akathisia:   No  Handed:  Right  AIMS (if indicated):     Assets:  Desire for Improvement  ADL's:  Intact  Cognition: WNL  Sleep:  Number of Hours: 6   Current Medications: Current Facility-Administered Medications  Medication Dose Route Frequency Provider Last Rate Last Dose  . amantadine (SYMMETREL) capsule 100 mg  100 mg Oral BID Delfin Gant, NP   100 mg at 04/28/15 0837  . carbamazepine (TEGRETOL XR) 12 hr tablet 100 mg  100 mg Oral BID Ursula Alert, MD   100 mg at 04/28/15 0837  . famotidine (PEPCID) tablet 20 mg  20 mg Oral BID Ursula Alert, MD   20 mg at 04/28/15 0932  . feeding supplement (ENSURE ENLIVE) (ENSURE ENLIVE) liquid 237 mL  237 mL Oral BID BM Saramma Eappen, MD   237 mL at 04/27/15 1606  . fluPHENAZine (PROLIXIN) tablet 5 mg  5 mg Oral QPM Ursula Alert, MD   5 mg at 04/27/15 1713  . fluPHENAZine decanoate (PROLIXIN) injection 12.5 mg  12.5 mg Intramuscular Q14 Days Saramma Eappen, MD   12.5 mg at 04/27/15 1432  . hydrochlorothiazide (HYDRODIURIL) tablet 25 mg  25 mg Oral Daily Delfin Gant, NP   25 mg at 04/28/15 0837  . LORazepam (ATIVAN) tablet 1 mg  1 mg Oral Q4H PRN Ursula Alert, MD   1 mg at 04/27/15 2124  . mirtazapine (REMERON) tablet 7.5 mg  7.5 mg Oral QHS Saramma Eappen, MD   7.5 mg at 04/27/15 2124  . naproxen (NAPROSYN) tablet 375 mg  375 mg Oral BID WC Saramma Eappen, MD   375 mg at 04/28/15 0837  . nicotine (NICODERM CQ - dosed in mg/24 hours) patch 21 mg  21 mg Transdermal Daily Saramma Eappen, MD   21 mg at 04/28/15 1312  . ondansetron (ZOFRAN-ODT) disintegrating tablet 8 mg  8 mg Oral Q8H PRN Ursula Alert, MD       Lab Results:  No results found for this or any previous visit (from the past 48 hour(s)).  Physical Findings: AIMS: Facial and Oral Movements Muscles of Facial Expression: None, normal Lips and Perioral Area: None, normal Jaw: None, normal Tongue: None, normal,Extremity Movements Upper (arms, wrists, hands, fingers): None,  normal Lower (legs, knees, ankles, toes): None, normal, Trunk Movements Neck, shoulders, hips: None, normal, Overall Severity Severity of abnormal movements (highest score from questions above): None, normal Incapacitation due to abnormal movements: None, normal Patient's awareness of abnormal movements (rate only patient's report): No Awareness, Dental Status Current problems with teeth and/or dentures?: No Does patient usually wear dentures?: No  CIWA:  CIWA-Ar Total: 0 COWS:     Assessment: Schizoaffective disorder, Bipolar-type  Treatment Plan Summary: Daily contact with patient to assess and evaluate symptoms and progress in treatment and Medication management  Will continue Prolixin 5 mg po daily for psychosis. Prolixin decanoate 12.5 mg IM today, repeat q14days. Will  continue Tegretol xr 100 mg po bid for mood lability. Will continue  Remeron 7.5 mg po qhs for insomnia. Prolactin elevated - to be monitored on an out pt basis. Will continue to monitor vitals ,medication compliance and treatment side effects while patient is here.  Will monitor for medical issues as well as call consult as needed.  CSW will start working on disposition.  Patient to participate in therapeutic milieu .   Medical Decision Making:  Review of Psycho-Social Stressors (1), Review or order clinical lab tests (1), Review of Last Therapy Session (1), Review of Medication Regimen & Side Effects (2) and Review of New Medication or Change in Dosage (2)  Encarnacion Slates, PMHNP, FNP-BC 04/28/2015, 2:00 PM I agree with assessment and plan Geralyn Flash A. Sabra Heck, M.D.

## 2015-04-28 NOTE — Progress Notes (Signed)
D: Pt denies SI/HI/AVH. Pt is pleasant and cooperative. Pt concerned about where he is going at D/C. Pt stated he did not want to go somewhere they yell at you, he said he needed a structured environment that provided positive reinforcement.   A: Pt was offered support and encouragement. Pt was given scheduled medications. Pt was encourage to attend groups. Q 15 minute checks were done for safety.   R:Pt attends groups and interacts well with peers and staff. Pt is taking medication. Pt has no complaints at this time .Pt receptive to treatment and safety maintained on unit.

## 2015-04-29 MED ORDER — FLUPHENAZINE DECANOATE 25 MG/ML IJ SOLN: 12.5000 mg | INTRAMUSCULAR | Status: DC

## 2015-04-29 MED ORDER — MIRTAZAPINE 7.5 MG PO TABS: 7.5000 mg | ORAL_TABLET | Freq: Every day | ORAL | Status: DC

## 2015-04-29 MED ORDER — CARBAMAZEPINE ER 200 MG PO TB12: 200.0000 mg | ORAL_TABLET | Freq: Two times a day (BID) | ORAL | Status: DC

## 2015-04-29 MED ORDER — HYDROCHLOROTHIAZIDE 25 MG PO TABS: 25.0000 mg | ORAL_TABLET | Freq: Every day | ORAL | Status: DC

## 2015-04-29 MED ORDER — CARBAMAZEPINE ER 200 MG PO TB12
200.0000 mg | ORAL_TABLET | Freq: Two times a day (BID) | ORAL | Status: DC
  Filled 2015-04-29 (×2): qty 6

## 2015-04-29 MED ORDER — FLUPHENAZINE HCL 5 MG PO TABS: 5.0000 mg | ORAL_TABLET | Freq: Every evening | ORAL | Status: DC

## 2015-04-29 MED ORDER — AMANTADINE HCL 100 MG PO CAPS: 100.0000 mg | ORAL_CAPSULE | Freq: Two times a day (BID) | ORAL | Status: DC

## 2015-04-29 MED ORDER — NICOTINE 21 MG/24HR TD PT24: 21.0000 mg | MEDICATED_PATCH | Freq: Every day | TRANSDERMAL | Status: AC

## 2015-04-29 NOTE — Tx Team (Signed)
Interdisciplinary Treatment Plan Update (Adult) Date: 04/29/2015   Date: 04/29/2015 10:45 AM  Progress in Treatment:  Attending groups: No Participating in groups: No Taking medication as prescribed: Yes  Tolerating medication: Yes  Family/Significant othe contact made: Yes with Pt's friend/payee Patient understands diagnosis: Yes Discussing patient identified problems/goals with staff: Yes  Medical problems stabilized or resolved: Yes  Denies suicidal/homicidal ideation: Yes Patient has not harmed self or Others: Yes   New problem(s) identified: None identified at this time.   Discharge Plan or Barriers: See below  Additional comments: n/a   Reason for Continuation of Hospitalization:    Estimated length of stay: D/C today  Review of initial/current patient goals per problem list:   1.  Goal(s): Patient will participate in aftercare plan  Met:  Yes  Target date: .3-5 days from date of admission   As evidenced by: Patient will participate within aftercare plan AEB aftercare provider and housing plan at discharge being identified.   04/24/15: Pt is requesting referrals to residential treatment center. CSW making appropriate referral. 04/29/2015 Pt states he will stay with friend tonight, go to [facility] tomorrow to check in at [employer]  Will work with ACT team to determine services while there  2.  Goal (s): Patient will exhibit decreased depressive symptoms and suicidal ideations.  Met:  Yes  Target date: 3-5 days from date of admission   As evidenced by: Patient will utilize self rating of depression at 3 or below and demonstrate decreased signs of depression or be deemed stable for discharge by MD. 04/24/15: Pt was admitted with symptoms of depression, rating 10/10. Pt continues to present with flat affect and depressive symptoms.  Pt will demonstrate decreased symptoms of depression and rate depression at 3/10 or lower prior to discharge. Denies SI  currently 04/29/2015 Pt denies depression today  4.  Goal(s): Patient will demonstrate decreased signs of withdrawal due to substance abuse  Met:  Yes  Target date: 3-5 days from date of admission   As evidenced by: Patient will produce a CIWA/COWS score of 0, have stable vitals signs, and no symptoms of withdrawal  04/24/15: Per Pt and chart review, no signs of withdrawal noted. 5.  Goal(s): Patient will demonstrate decreased signs of psychosis  . Met:  Yes . Target date: 3-5 days from date of admission  . As evidenced by: Patient will demonstrate decreased frequency of AVH or return to baseline function    -04/24/15: Pt continues to endorse command hallucinations. 04/29/2015 No sings nor symptoms of psychosis today  Attendees:  Patient:    Family:    Physician: Dr. Shea Evans, MD  04/29/2015 10:45 AM  Nursing: Lars Pinks, RN Case manager  04/29/2015 10:45 AM  Clinical Social Worker Norman Clay, MSW 04/29/2015 10:45 AM  Other: Jake Bathe Liasion 04/29/2015 10:45 AM  Clinical: Manuella Ghazi, RN 04/29/2015 10:45 AM  Other: , RN Charge Nurse 04/29/2015 10:45 AM  Other:     Peri Maris, Latanya Presser MSW
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
## 2015-04-29 NOTE — Progress Notes (Signed)
Nsg D/C Note:Pt denies si/hi at this time. Denies A/V Hallucinations and states readiness for discharge. States he will comply with outpt services and take his meds as prescribed. D/C to guardian.Mrs.McDugle ) Healthcare POA) for transport to [place] arrangements.

## 2015-04-29 NOTE — [facility] Group Notes (Signed)
[place] Group Notes:  (Nursing/MHT/Case Management/Adjunct)  Date:  04/29/2015  Time:  12:26 PM  Type of Therapy:  Group Therapy  Participation Level:  Minimal  Participation Quality:  Appropriate and Attentive  Affect:  Blunted  Cognitive:  Alert and Appropriate  Insight:  Limited  Engagement in Group:  Limited  Modes of Intervention:  Discussion and Education  Summary of Progress/Problems:  Purpose of the group was recovery and goals. Discussed the importance of setting appropriate and measurable goals. Reviewed sleep hygiene. Ryan Choi attended group.  He was pleasant but had minimal participation in the group topic.    Barbette Or Ryan Choi 04/29/2015, 12:26 PM

## 2015-04-29 NOTE — [facility] Suicide Risk Assessment (Signed)
[facility] Discharge Suicide Risk Assessment   Demographic Factors:  Male  Total Time spent with patient: 30 minutes  Musculoskeletal: Strength & Muscle Tone: within normal limits Gait & Station: normal Patient leans: N/A  Psychiatric Specialty Exam: Physical Exam  Review of Systems  Psychiatric/Behavioral: Positive for substance abuse. Negative for depression, suicidal ideas and hallucinations.  All other systems reviewed and are negative.   Blood pressure 140/95, pulse 80, temperature 97.7 F (36.5 C), temperature source Oral, resp. rate 20, height 5\' 9"  (1.753 m), weight 80.173 kg (176 lb 12 oz).Body mass index is 26.09 kg/(m^2).  General Appearance: Casual  Eye Contact::  Fair  Speech:  Clear and [id number]  Volume:  Normal  Mood:  Euthymic  Affect:  Appropriate  Thought Process:  Coherent  Orientation:  Full (Time, Place, and Person)  Thought Content:  WDL  Suicidal Thoughts:  No  Homicidal Thoughts:  No  Memory:  Immediate;   Fair Recent;   Fair Remote;   Fair  Judgement:  Fair  Insight:  Fair  Psychomotor Activity:  Normal  Concentration:  Fair  Recall:  [employer] of Knowledge:Fair  Language: Fair  Akathisia:  No  Handed:  Right  AIMS (if indicated):     Assets:  Communication Skills Desire for Improvement  Sleep:  Number of Hours: 6  Cognition: WNL  ADL's:  Intact   Have you used any form of tobacco in the last 30 days? (Cigarettes, Smokeless Tobacco, Cigars, and/or Pipes): Yes  Has this patient used any form of tobacco in the last 30 days? (Cigarettes, Smokeless Tobacco, Cigars, and/or Pipes) Yes, Prescription  provided for nicotine patches  Mental Status Per Nursing Assessment::   On Admission:  NA  Current Mental Status by Physician: pt denies SI/HI/AH/VH  Loss Factors: NA  Historical Factors: Impulsivity  Risk Reduction Factors:   Living with another person, especially a relative and Positive social support  Continued Clinical Symptoms:   Alcohol/Substance Abuse/Dependencies Previous Psychiatric Diagnoses and Treatments  Cognitive Features That Contribute To Risk:  None    Suicide Risk:  Minimal: No identifiable suicidal ideation.  Patients presenting with no risk factors but with morbid ruminations; may be classified as minimal risk based on the severity of the depressive symptoms  Principal Problem: Schizoaffective disorder, bipolar type Discharge Diagnoses:  Patient Active Problem List   Diagnosis Date Noted  . Hyperprolactinemia [E22.1] 04/26/2015  . HTN (hypertension) [I10] 04/24/2015  . Hyperlipidemia [E78.5] 04/24/2015  . Cocaine use disorder, severe, dependence [F14.20] 04/24/2015  . Alcohol use disorder, moderate, dependence [F10.20] 04/24/2015  . Cannabis use disorder, severe, dependence [F12.20] 04/24/2015  . Tobacco use disorder [Z72.0] 04/24/2015  . Schizoaffective disorder, bipolar type [F25.0] 04/23/2015  . ERECTILE DYSFUNCTION [F52.8] 06/05/2007    Follow-up Information    Follow up with [facility].   Contact information:   [address] [place] [address] [phone]      Plan Of Care/Follow-up recommendations:  Activity:  No restrictions Diet:  regular Tests:  Follow up Prolactin level every 6 months - this was discussed with patient Other:  Prolixin decanoate IM 12.5 mg q14 days - last dose 04/27/15.  Is patient on multiple antipsychotic therapies at discharge:  No   Has Patient had three or more failed trials of antipsychotic monotherapy by history:  No  Recommended Plan for Multiple Antipsychotic Therapies: NA    Deborahann Poteat md 04/29/2015, 9:36 AM

## 2015-04-29 NOTE — Progress Notes (Signed)
  [facility] Adult Case Management Discharge Plan :  Will you be returning to the same living situation after discharge:  No. At discharge, do you have transportation home?: Yes,  sister Do you have the ability to pay for your medications: Yes,  MCD  Release of information consent forms completed and in the chart;  Patient's signature needed at discharge.  Patient to Follow up at: Follow-up Information    Follow up with [facility].   Why:  Call them to let them know the plan post d/c.   Contact information:   [address] [place] [place] [address] [phone]      Patient denies SI/HI: Yes,  yes    Safety Planning and Suicide Prevention discussed: Yes,  yes  Have you used any form of tobacco in the last 30 days? (Cigarettes, Smokeless Tobacco, Cigars, and/or Pipes): Yes  Has patient been referred to the Quitline?: Yes, faxed on 04/29/15  [name] 04/29/2015, 10:31 AM

## 2016-04-08 ENCOUNTER — Encounter (HOSPITAL_COMMUNITY): Payer: Self-pay | Admitting: *Deleted

## 2016-04-08 ENCOUNTER — Emergency Department (HOSPITAL_COMMUNITY)
Admission: EM | Admit: 2016-04-08 | Discharge: 2016-04-09 | Disposition: A | Discharge status: Other Acute Inpatient Hospital | Payer: Medicaid Other | Attending: Emergency Medicine | Admitting: Emergency Medicine

## 2016-04-08 DIAGNOSIS — R443 Hallucinations, unspecified: Secondary | ICD-10-CM | POA: Insufficient documentation

## 2016-04-08 DIAGNOSIS — F250 Schizoaffective disorder, bipolar type: Secondary | ICD-10-CM | POA: Diagnosis present

## 2016-04-08 DIAGNOSIS — I10 Essential (primary) hypertension: Secondary | ICD-10-CM | POA: Insufficient documentation

## 2016-04-08 DIAGNOSIS — F17210 Nicotine dependence, cigarettes, uncomplicated: Secondary | ICD-10-CM | POA: Insufficient documentation

## 2016-04-08 DIAGNOSIS — Z9114 Patient's other noncompliance with medication regimen: Secondary | ICD-10-CM | POA: Diagnosis not present

## 2016-04-08 DIAGNOSIS — F1220 Cannabis dependence, uncomplicated: Secondary | ICD-10-CM | POA: Diagnosis present

## 2016-04-08 DIAGNOSIS — F1020 Alcohol dependence, uncomplicated: Secondary | ICD-10-CM

## 2016-04-08 DIAGNOSIS — Z79899 Other long term (current) drug therapy: Secondary | ICD-10-CM | POA: Insufficient documentation

## 2016-04-08 DIAGNOSIS — R45851 Suicidal ideations: Secondary | ICD-10-CM | POA: Diagnosis not present

## 2016-04-08 DIAGNOSIS — F1420 Cocaine dependence, uncomplicated: Secondary | ICD-10-CM | POA: Diagnosis present

## 2016-04-08 LAB — COMPREHENSIVE METABOLIC PANEL
ALK PHOS: 67 U/L (ref 38–126)
ALT: 16 U/L — AB (ref 17–63)
AST: 17 U/L (ref 15–41)
Albumin: 4.2 g/dL (ref 3.5–5.0)
Anion gap: 9 (ref 5–15)
BILIRUBIN TOTAL: 0.7 mg/dL (ref 0.3–1.2)
BUN: 10 mg/dL (ref 6–20)
CALCIUM: 8.9 mg/dL (ref 8.9–10.3)
CO2: 25 mmol/L (ref 22–32)
CREATININE: 0.86 mg/dL (ref 0.61–1.24)
Chloride: 104 mmol/L (ref 101–111)
Glucose, Bld: 78 mg/dL (ref 65–99)
Potassium: 3.3 mmol/L — ABNORMAL LOW (ref 3.5–5.1)
Sodium: 138 mmol/L (ref 135–145)
TOTAL PROTEIN: 7.5 g/dL (ref 6.5–8.1)

## 2016-04-08 LAB — RAPID URINE DRUG SCREEN, HOSP PERFORMED
Amphetamines: NOT DETECTED
Barbiturates: NOT DETECTED
Benzodiazepines: NOT DETECTED
Cocaine: POSITIVE — AB
OPIATES: NOT DETECTED
TETRAHYDROCANNABINOL: POSITIVE — AB

## 2016-04-08 LAB — CBC
HCT: 44.9 % (ref 39.0–52.0)
HEMOGLOBIN: 14.5 g/dL (ref 13.0–17.0)
MCH: 26.7 pg (ref 26.0–34.0)
MCHC: 32.3 g/dL (ref 30.0–36.0)
MCV: 82.5 fL (ref 78.0–100.0)
PLATELETS: 307 10*3/uL (ref 150–400)
RBC: 5.44 MIL/uL (ref 4.22–5.81)
RDW: 16.4 % — AB (ref 11.5–15.5)
WBC: 11.1 10*3/uL — ABNORMAL HIGH (ref 4.0–10.5)

## 2016-04-08 LAB — SALICYLATE LEVEL

## 2016-04-08 LAB — ETHANOL: ALCOHOL ETHYL (B): 9 mg/dL — AB (ref <5)

## 2016-04-08 LAB — ACETAMINOPHEN LEVEL: Acetaminophen (Tylenol), Serum: <10 ug/mL — ABNORMAL LOW (ref 10–30)

## 2016-04-08 MED ORDER — LORAZEPAM 2 MG/ML IJ SOLN: 1.0000 mg | Freq: Once | INTRAMUSCULAR | Status: DC

## 2016-04-08 NOTE — ED Triage Notes (Signed)
Pt arrives to the ER under IVC; pt's sister took out IVC stating that pt is not taking his medication as prescribed; pt has been found with his pockets full of medications that were not prescribed to patient; sister reports that pt has been uses crack and marijuana and other unknown "pills"; sister reports that pt has been hearing voices and has been making statements "I feel like hurting myself"; pt denies SI / HI in triage; pt states that he has not been taking his medication; pt has also made statements that he has thoughts of hurting people that have tried to help him

## 2016-04-08 NOTE — BH Assessment (Signed)
Assessment completed. Consulted with Darlyne Russian, PA-C who states that patient meets inpatient criteria due to hallucinations and need for prolixin and SA.   Rosalin Hawking, LCSW Therapeutic Triage Specialist [place] 04/08/2016 11:38 PM

## 2016-04-08 NOTE — ED Provider Notes (Signed)
[facility] DEPT Provider Note   CSN: [id number] Arrival date & time: 04/08/16  1918  First Provider Contact:  None       History   Chief Complaint Chief Complaint  Patient presents with  . IVC    HPI Ryan Choi is a 45 y.o. male.  Patient presents under IVC petition, taken out by sister who is concerned about suicidal behavior. Per IVC, the patient is not taking medications for history of schizophrenia, has voiced suicidal ideation, and has been found with a "pocket full" of unknown pills. The patient denies SI or suicidal intent. He endorses auditory hallucinations described as voices that tell him to get into trouble. No physical complaints currently.      Past Medical History:  Diagnosis Date  . Hypercholesteremia   . Hypertension   . Schizo affective schizophrenia [facility])     Patient Active Problem List   Diagnosis Date Noted  . Hyperprolactinemia ([place]) 04/26/2015  . HTN (hypertension) 04/24/2015  . Hyperlipidemia 04/24/2015  . Cocaine use disorder, severe, dependence ([place]) 04/24/2015  . Alcohol use disorder, moderate, dependence ([place]) 04/24/2015  . Cannabis use disorder, severe, dependence ([place]) 04/24/2015  . Tobacco use disorder 04/24/2015  . Schizoaffective disorder, bipolar type ([place]) 04/23/2015  . ERECTILE DYSFUNCTION 06/05/2007    Past Surgical History:  Procedure Laterality Date  . CHOLECYSTECTOMY         Home Medications    Prior to Admission medications   Medication Sig Start Date End Date Taking? Authorizing Provider  buPROPion (WELLBUTRIN) 75 MG tablet Take 75 mg by mouth every morning. 03/25/16  Yes Historical Provider, MD  busPIRone (BUSPAR) 10 MG tablet Take 10 mg by mouth 2 (two) times daily. 03/25/16  Yes Historical Provider, MD  fluPHENAZine decanoate (PROLIXIN) 25 MG/ML injection Inject 0.5 mLs (12.5 mg total) into the muscle every 14 (fourteen) days. 05/12/15  Yes Niel Hummer, NP  hydrOXYzine (ATARAX/VISTARIL) 25 MG tablet TAKE  2 TABLETS BY MOUTH AT BEDTIME FOR SLEEP 03/25/16  Yes Historical Provider, MD  meloxicam (MOBIC) 7.5 MG tablet Take 7.5 mg by mouth daily as needed. for pain 03/25/16  Yes Historical Provider, MD  mirtazapine (REMERON) 15 MG tablet Take 15 mg by mouth at bedtime. 03/25/16  Yes Historical Provider, MD  TEGRETOL 200 MG tablet Take 200 mg by mouth 2 (two) times daily. 03/25/16  Yes Historical Provider, MD  albuterol (PROVENTIL HFA;VENTOLIN HFA) 108 (90 BASE) MCG/ACT inhaler Inhale 1-2 puffs into the lungs every 6 (six) hours as needed for wheezing or shortness of breath. Patient not taking: Reported on 04/22/2015 02/12/15   Leo Grosser, MD  amantadine (SYMMETREL) 100 MG capsule Take 1 capsule (100 mg total) by mouth 2 (two) times daily. Patient not taking: Reported on 04/08/2016 04/29/15   Niel Hummer, NP  carbamazepine (TEGRETOL XR) 200 MG 12 hr tablet Take 1 tablet (200 mg total) by mouth 2 (two) times daily. Patient not taking: Reported on 04/08/2016 04/29/15   Niel Hummer, NP  fluPHENAZine (PROLIXIN) 5 MG tablet Take 1 tablet (5 mg total) by mouth every evening. Patient not taking: Reported on 04/08/2016 04/29/15   Niel Hummer, NP  hydrochlorothiazide (HYDRODIURIL) 25 MG tablet Take 1 tablet (25 mg total) by mouth daily. Patient not taking: Reported on 04/08/2016 04/29/15   Niel Hummer, NP  mirtazapine (REMERON) 7.5 MG tablet Take 1 tablet (7.5 mg total) by mouth at bedtime. Patient not taking: Reported on 04/08/2016 04/29/15   Niel Hummer,  NP  nicotine (NICODERM CQ - DOSED IN MG/24 HOURS) 21 mg/24hr patch Place 1 patch (21 mg total) onto the skin daily. Patient not taking: Reported on 04/08/2016 04/29/15   Niel Hummer, NP    Family History Family History  Problem Relation Age of Onset  . Hypertension Mother   . Diabetes Mother   . Mental illness Neg Hx     Social History Social History  Substance Use Topics  . Smoking status: Current Every Day Smoker    Packs/day: 0.50    Types: Cigarettes  .  Smokeless tobacco: Never Used  . Alcohol use 1.2 oz/week    2 Cans of beer per week     Allergies   Ibuprofen; Omeprazole; and Tylenol [acetaminophen]   Review of Systems Review of Systems  Constitutional: Negative for chills and fever.  HENT: Negative.   Respiratory: Negative.   Cardiovascular: Negative.   Gastrointestinal: Negative.   Musculoskeletal: Negative.   Skin: Negative.   Neurological: Negative.   Psychiatric/Behavioral: Positive for hallucinations. Negative for suicidal ideas.     Physical Exam Updated Vital Signs BP (!) 162/106 (BP Location: Left Arm)   Pulse 73   Temp 98.2 F (36.8 C) (Oral)   Resp 18   Ht 5\' 8"  (1.727 m)   SpO2 98%   Physical Exam  Constitutional: He appears well-developed and well-nourished.  HENT:  Head: Normocephalic.  Neck: Normal range of motion. Neck supple.  Cardiovascular: Normal rate and regular rhythm.   Pulmonary/Chest: Effort normal and breath sounds normal. He has no wheezes. He has no rales.  Abdominal: Soft. Bowel sounds are normal. There is no tenderness. There is no rebound and no guarding.  Musculoskeletal: Normal range of motion.  Neurological: He is alert. No cranial nerve deficit.  Skin: Skin is warm and dry. No rash noted.  Psychiatric: He has a normal mood and affect. His speech is normal and behavior is normal. He is actively hallucinating.     ED Treatments / Results  Labs (all labs ordered are listed, but only abnormal results are displayed) Labs Reviewed  COMPREHENSIVE METABOLIC PANEL - Abnormal; Notable for the following:       Result Value   Potassium 3.3 (*)    ALT 16 (*)    All other components within normal limits  ETHANOL - Abnormal; Notable for the following:    Alcohol, Ethyl (B) 9 (*)    All other components within normal limits  ACETAMINOPHEN LEVEL - Abnormal; Notable for the following:    Acetaminophen (Tylenol), Serum <10 (*)    All other components within normal limits  CBC -  Abnormal; Notable for the following:    WBC 11.1 (*)    RDW 16.4 (*)    All other components within normal limits  URINE RAPID DRUG SCREEN, HOSP PERFORMED - Abnormal; Notable for the following:    Cocaine POSITIVE (*)    Tetrahydrocannabinol POSITIVE (*)    All other components within normal limits  SALICYLATE LEVEL    EKG  EKG Interpretation None       Radiology No results found.  Procedures Procedures (including critical care time)  Medications Ordered in ED Medications - No data to display   Initial Impression / Assessment and Plan / ED Course  I have reviewed the triage vital signs and the nursing notes.  Pertinent labs & imaging results that were available during my care of the patient were reviewed by me and considered in my medical decision making (  see chart for details).  Clinical Course    The patient is under IVC for suicidal ideation and medication non-compliance in treatment of schizophrenia. He endorses hallucinations. Apparently, he was seen at [facility] earlier today and discharged home. Will have TTS evaluation to determine disposition.  The patient is felt to meet inpatient criteria and will be placed.   Final Clinical Impressions(s) / ED Diagnoses   Final diagnoses:  None  1. Hallucinations 2. Medication non-compliance 3. H/o schizophrenia New Prescriptions New Prescriptions   No medications on file     Charlann Lange, Hershal Coria 04/09/16 0442    Tanna Furry, MD 04/20/16 [phone]

## 2016-04-08 NOTE — BH Assessment (Addendum)
Assessment Note  Ryan Choi is an 45 y.o. male presents to [facility] under IVC by his sister Ryan Choi [phone].  IVC states:  Danger to self. Respondent is diagnosed with Schizophrenia and is prescribed medication for that condition but does not take medication as prescribed.  Was found with a pocket full of pills that were not his medication. Petitioner believes he uses crack and weed in addition to unknown pills, on a regular basis. Respondent has been previously committed at mental health, behavioral health, and [facility]. Respondent has been hearing voices. "I feel like doing something to myself, I feel like hurting myself" he has also threatened to hurt other individuals that have tried to help him.   Patient states that "she thought I spent $217 on crack in three days, but that's not true, I got a $20 bag and smoked a little bit of weed but I have been drinking. I've been off my medicine."  Patient states that he is does not know what medications he is prescribed and states that he has not taken them since July 14 but states that he has gotten a shot about three weeks ago but thinks that he needs another one.  Patient states that he is getting prolixin via shot but states "it's not working." Patient states that he would like to get back on his medications. Patient denies SI and history of attempts. Patient denies statements about hurting himself on the IVC. Patient denies HI and states that he thought about hurting someone while in the [facility] program in [place]. Patient states that he has had auditory hallucinations since 1994. When asked about the nature of the voices he reports that the voices "tell me to do things" and when asked about the type of things he states "it depends on how mad the person makes me, the voices tell me to do bad things." patient states that he also thinks that he has "visions" of things that have not happened yet stating, "one time I had a dream  that somebody was chasing me, but it really happened." Patient states that he uses "crack" cocaine "when I want to have sex" and states that he uses it about twice a month. Patient states that he uses THC daily. Patient states that he previously drank "all day everyday" but states that he no longer likes the taste of alcohol and does not drink that often. Patient denies allegations of IVC regarding medications states "I don't take pills unless it's my pills, I don;t like pills and stuff like that." Patient UDS + cocaine, + THC. Patient BAL 9 at time of assessment.   Consulted with Darlyne Russian, PA-C who recommends inpatient treatment.    Diagnosis: Schizoaffective Diosrder  Past Medical History:  Past Medical History:  Diagnosis Date  . Hypercholesteremia   . Hypertension   . Schizo affective schizophrenia [facility])     Past Surgical History:  Procedure Laterality Date  . CHOLECYSTECTOMY      Family History:  Family History  Problem Relation Age of Onset  . Hypertension Mother   . Diabetes Mother   . Mental illness Neg Hx     Social History:  reports that he has been smoking Cigarettes.  He has been smoking about 0.50 packs per day. He has never used smokeless tobacco. He reports that he drinks about 1.2 oz of alcohol per week . He reports that he uses drugs, including Cocaine and Marijuana.  Additional Social History:  Alcohol / Drug  Use Pain Medications: Denies Prescriptions: Denies Over the Counter: Denies History of alcohol / drug use?: Yes Longest period of sobriety (when/how long): one year Substance #1 Name of Substance 1: "crack"  1 - Age of First Use: 26 1 - Amount (size/oz): "depends on what they buy" 1 - Frequency: "once or twice a month" 1 - Duration: ongoing 1 - Last Use / Amount: yesterday, $20 Substance #2 Name of Substance 2: THC 2 - Age of First Use: 13 2 - Amount (size/oz): "whatever" 2 - Frequency: "maybe whenever I can" 2 - Duration: ongoing 2 - Last  Use / Amount: 2-3 days ago Substance #3 Name of Substance 3: Alcohol 3 - Age of First Use: 8 3 - Amount (size/oz): UKN 3 - Frequency: "not often" "used to be everyday all day, but I don't like the taste that much" 3 - Duration: ongoing 3 - Last Use / Amount: Today, one beer  CIWA: CIWA-Ar BP: (!) 162/106 Pulse Rate: 73 COWS:    Allergies:  Allergies  Allergen Reactions  . Ibuprofen     Flu symptoms   . Omeprazole     Makes pt agitated   . Tylenol [Acetaminophen]     Flu like symptoms     Home Medications:  (Not in a hospital admission)  OB/GYN Status:  No LMP for male patient.  General Assessment Data [facility] of [facility]: [facility] TTS Assessment: In system Is this a Tele or Face-to-Face Assessment?: Face-to-Face Is this an Initial Assessment or a Re-assessment for this encounter?: Initial Assessment Marital status: Single Is patient pregnant?: No Pregnancy Status: No Living Arrangements: Other (Comment) (homeless for three months) Can pt return to current living arrangement?: Yes Admission Status: Involuntary Is patient capable of signing voluntary admission?: No (IVC) Referral Source: Other (IVC)     Crisis Care Plan Living Arrangements: Other (Comment) (homeless for three months) Name of Psychiatrist: None Name of Therapist: None  Education Status Is patient currently in school?: No Highest grade of school patient has completed: 3 years of college  Risk to self with the past 6 months Suicidal Ideation: No Has patient been a risk to self within the past 6 months prior to admission? : No Suicidal Intent: No Has patient had any suicidal intent within the past 6 months prior to admission? : No Is patient at risk for suicide?: No Suicidal Plan?: No Has patient had any suicidal plan within the past 6 months prior to admission? : No Access to Means: No What has been your use of drugs/alcohol within the last 12 months?: Denies Previous Attempts/Gestures:  No How many times?: 0 Other Self Harm Risks: Denies Triggers for Past Attempts: None known Intentional Self Injurious Behavior: None Family Suicide History: No Recent stressful life event(s): Other (Comment) ("life" - "voices real bad") Persecutory voices/beliefs?: No Depression: Yes Depression Symptoms: Insomnia, Tearfulness, Isolating, Fatigue, Guilt, Loss of interest in usual pleasures, Feeling angry/irritable Substance abuse history and/or treatment for substance abuse?: Yes Suicide prevention information given to non-admitted patients: Not applicable  Risk to Others within the past 6 months Homicidal Ideation: No Does patient have any lifetime risk of violence toward others beyond the six months prior to admission? : No Thoughts of Harm to Others: No Current Homicidal Intent: No Current Homicidal Plan: No Access to Homicidal Means: No Identified Victim: Denies History of harm to others?: No Assessment of Violence: None Noted Violent Behavior Description: Denies Does patient have access to weapons?: No Criminal Charges Pending?: No Does patient  have a court date: No Is patient on probation?: No  Psychosis Hallucinations: Auditory ("to hurt people" "depends on how mad they make me" ) Delusions: None noted  Mental Status Report Appearance/Hygiene: Body odor, In scrubs Eye Contact: Unable to Assess Motor Activity: Unable to assess Speech: Logical/coherent, Slurred Level of Consciousness: Alert Mood: Pleasant Affect: Appropriate to circumstance Anxiety Level: None Thought Processes: Coherent, Relevant Judgement: Partial Obsessive Compulsive Thoughts/Behaviors: None  Cognitive Functioning Concentration: Decreased Memory: Recent Intact, Remote Intact IQ: Average Insight: Fair Impulse Control: Fair Appetite: Fair Sleep: Decreased Vegetative Symptoms: None  ADLScreening [facility]) Patient's cognitive ability adequate to safely complete daily  activities?: Yes Patient able to express need for assistance with ADLs?: Yes Independently performs ADLs?: Yes (appropriate for developmental age)  Prior Inpatient Therapy Prior Inpatient Therapy: Yes Prior Therapy Dates: 1996 Prior Therapy Facilty/Provider(s): [place] Reason for Treatment: "mental health, substance abuse"  Prior Outpatient Therapy Prior Outpatient Therapy: Yes Prior Therapy Dates: Last year Prior Therapy Facilty/Provider(s): [place] Reason for Treatment: "everything" Does patient have an ACCT team?: No Does patient have Intensive In-House Services?  : No Does patient have [facility] services? : No Does patient have P4CC services?: No  ADL Screening (condition at time of admission) Patient's cognitive ability adequate to safely complete daily activities?: Yes Is the patient deaf or have difficulty hearing?: No Does the patient have difficulty seeing, even when wearing glasses/contacts?: No Does the patient have difficulty concentrating, remembering, or making decisions?: No Patient able to express need for assistance with ADLs?: Yes Does the patient have difficulty dressing or bathing?: No Independently performs ADLs?: Yes (appropriate for developmental age) Does the patient have difficulty walking or climbing stairs?: No Weakness of Legs: None Weakness of Arms/Hands: None  Home Assistive Devices/Equipment Home Assistive Devices/Equipment: None  Therapy Consults (therapy consults require a physician order) PT Evaluation Needed: No OT Evalulation Needed: No SLP Evaluation Needed: No Abuse/Neglect Assessment (Assessment to be complete while patient is alone) Physical Abuse: Denies Verbal Abuse: Denies Sexual Abuse: Denies Exploitation of patient/patient's resources: Denies Self-Neglect: Denies Values / Beliefs Cultural Requests During Hospitalization: None Spiritual Requests During Hospitalization: None Consults Spiritual Care Consult Needed:  No Social Work Consult Needed: No Regulatory affairs officer (For Healthcare) Does patient have an advance directive?: No Would patient like information on creating an advanced directive?: No - patient declined information    Additional Information 1:1 In Past 12 Months?: No CIRT Risk: No Elopement Risk: No Does patient have medical clearance?: No     Disposition:  Disposition Initial Assessment Completed for this Encounter: Yes  On Site Evaluation by:   Reviewed with Physician:    Daymion Nazaire 04/08/2016 11:08 PM

## 2016-04-08 NOTE — ED Notes (Signed)
Pt has in belonging bag:  Blue jeans, white t-shirt, white socks, white and blue tennis shoes.

## 2016-04-09 ENCOUNTER — Inpatient Hospital Stay (HOSPITAL_COMMUNITY)
Admission: AD | Admit: 2016-04-09 | Discharge: 2016-04-18 | DRG: 885 | Disposition: A | Discharge status: Home / Self Care | Payer: MEDICAID | Attending: Psychiatry | Admitting: Psychiatry

## 2016-04-09 ENCOUNTER — Encounter (HOSPITAL_COMMUNITY): Payer: Self-pay

## 2016-04-09 DIAGNOSIS — F1020 Alcohol dependence, uncomplicated: Secondary | ICD-10-CM | POA: Diagnosis present

## 2016-04-09 DIAGNOSIS — F1420 Cocaine dependence, uncomplicated: Secondary | ICD-10-CM | POA: Diagnosis present

## 2016-04-09 DIAGNOSIS — R45851 Suicidal ideations: Secondary | ICD-10-CM | POA: Diagnosis not present

## 2016-04-09 DIAGNOSIS — F17210 Nicotine dependence, cigarettes, uncomplicated: Secondary | ICD-10-CM | POA: Diagnosis present

## 2016-04-09 DIAGNOSIS — F250 Schizoaffective disorder, bipolar type: Principal | ICD-10-CM | POA: Diagnosis present

## 2016-04-09 DIAGNOSIS — F1220 Cannabis dependence, uncomplicated: Secondary | ICD-10-CM | POA: Diagnosis present

## 2016-04-09 DIAGNOSIS — I10 Essential (primary) hypertension: Secondary | ICD-10-CM | POA: Diagnosis present

## 2016-04-09 DIAGNOSIS — R443 Hallucinations, unspecified: Secondary | ICD-10-CM | POA: Diagnosis not present

## 2016-04-09 MED ORDER — FLUPHENAZINE HCL 5 MG PO TABS
5.0000 mg | ORAL_TABLET | Freq: Every evening | ORAL | Status: DC
  Administered 2016-04-10 – 2016-04-11 (×2): 5 mg via ORAL
  Filled 2016-04-09 (×3): qty 1

## 2016-04-09 MED ORDER — HYDROCHLOROTHIAZIDE 25 MG PO TABS
25.0000 mg | ORAL_TABLET | Freq: Every day | ORAL | Status: DC
  Administered 2016-04-09: 25 mg via ORAL
  Filled 2016-04-09: qty 1

## 2016-04-09 MED ORDER — FLUPHENAZINE HCL 5 MG PO TABS
5.0000 mg | ORAL_TABLET | Freq: Every evening | ORAL | Status: DC
  Filled 2016-04-09: qty 1

## 2016-04-09 MED ORDER — TRAZODONE HCL 100 MG PO TABS: 100.0000 mg | ORAL_TABLET | Freq: Every day | ORAL | Status: DC

## 2016-04-09 MED ORDER — CARBAMAZEPINE ER 200 MG PO TB12
200.0000 mg | ORAL_TABLET | Freq: Two times a day (BID) | ORAL | Status: DC
  Administered 2016-04-09 – 2016-04-18 (×18): 200 mg via ORAL
  Filled 2016-04-09 (×23): qty 1

## 2016-04-09 MED ORDER — FLUPHENAZINE DECANOATE 25 MG/ML IJ SOLN: 12.5000 mg | INTRAMUSCULAR | Status: DC

## 2016-04-09 MED ORDER — ALBUTEROL SULFATE HFA 108 (90 BASE) MCG/ACT IN AERS: 1.0000 | INHALATION_SPRAY | Freq: Four times a day (QID) | RESPIRATORY_TRACT | Status: DC | PRN

## 2016-04-09 MED ORDER — CARBAMAZEPINE ER 200 MG PO TB12
200.0000 mg | ORAL_TABLET | Freq: Two times a day (BID) | ORAL | Status: DC
  Administered 2016-04-09: 200 mg via ORAL
  Filled 2016-04-09 (×2): qty 1

## 2016-04-09 MED ORDER — NICOTINE 21 MG/24HR TD PT24
21.0000 mg | MEDICATED_PATCH | Freq: Every day | TRANSDERMAL | Status: DC
  Administered 2016-04-10 – 2016-04-18 (×7): 21 mg via TRANSDERMAL
  Filled 2016-04-09 (×11): qty 1

## 2016-04-09 MED ORDER — ALBUTEROL SULFATE (2.5 MG/3ML) 0.083% IN NEBU: 3.0000 mL | INHALATION_SOLUTION | Freq: Four times a day (QID) | RESPIRATORY_TRACT | Status: DC | PRN

## 2016-04-09 MED ORDER — HYDROCHLOROTHIAZIDE 25 MG PO TABS
25.0000 mg | ORAL_TABLET | Freq: Every day | ORAL | Status: DC
  Administered 2016-04-10 – 2016-04-18 (×9): 25 mg via ORAL
  Filled 2016-04-09 (×11): qty 1

## 2016-04-09 MED ORDER — LORAZEPAM 1 MG PO TABS
1.0000 mg | ORAL_TABLET | Freq: Once | ORAL | Status: AC
  Administered 2016-04-09: 1 mg via ORAL
  Filled 2016-04-09: qty 1

## 2016-04-09 MED ORDER — TRAZODONE HCL 100 MG PO TABS
100.0000 mg | ORAL_TABLET | Freq: Every evening | ORAL | Status: DC | PRN
  Administered 2016-04-10 – 2016-04-11 (×3): 100 mg via ORAL
  Filled 2016-04-09 (×8): qty 1

## 2016-04-09 MED ORDER — FLUPHENAZINE DECANOATE 25 MG/ML IJ SOLN
12.5000 mg | INTRAMUSCULAR | Status: DC
  Administered 2016-04-09: 12.5 mg via INTRAMUSCULAR
  Filled 2016-04-09: qty 0.5

## 2016-04-09 MED ORDER — AMANTADINE HCL 100 MG PO CAPS
100.0000 mg | ORAL_CAPSULE | Freq: Two times a day (BID) | ORAL | Status: DC
  Administered 2016-04-09: 100 mg via ORAL
  Filled 2016-04-09: qty 1

## 2016-04-09 MED ORDER — MAGNESIUM HYDROXIDE 400 MG/5ML PO SUSP: 30.0000 mL | Freq: Every day | ORAL | Status: DC | PRN

## 2016-04-09 MED ORDER — ALUM & MAG HYDROXIDE-SIMETH 200-200-20 MG/5ML PO SUSP: 30.0000 mL | ORAL | Status: DC | PRN

## 2016-04-09 MED ORDER — AMANTADINE HCL 100 MG PO CAPS
100.0000 mg | ORAL_CAPSULE | Freq: Two times a day (BID) | ORAL | Status: DC
  Administered 2016-04-09 – 2016-04-18 (×18): 100 mg via ORAL
  Filled 2016-04-09 (×23): qty 1

## 2016-04-09 MED ORDER — TRAZODONE HCL 100 MG PO TABS
100.0000 mg | ORAL_TABLET | Freq: Every day | ORAL | Status: DC
  Administered 2016-04-09: 100 mg via ORAL
  Filled 2016-04-09 (×2): qty 1

## 2016-04-09 MED ORDER — LORAZEPAM 1 MG PO TABS
2.0000 mg | ORAL_TABLET | Freq: Once | ORAL | Status: AC
  Administered 2016-04-09: 2 mg via ORAL
  Filled 2016-04-09: qty 2

## 2016-04-09 NOTE — Tx Team (Signed)
Initial Interdisciplinary Treatment Plan   PATIENT STRESSORS: Financial difficulties Medication change or noncompliance   PATIENT STRENGTHS: Curator fund of knowledge   PROBLEM LIST: Problem List/Patient Goals Date to be addressed Date deferred Reason deferred Estimated date of resolution  Psychosis 04/09/16     Homeless 04/09/16     "sleep" 04/09/16     "rest" 04/09/16                                    DISCHARGE CRITERIA:  Improved stabilization in mood, thinking, and/or behavior Verbal commitment to aftercare and medication compliance  PRELIMINARY DISCHARGE PLAN: Outpatient therapy Medication management  PATIENT/FAMIILY INVOLVEMENT: This treatment plan has been presented to and reviewed with the patient, Ryan Choi.  The patient and family have been given the opportunity to ask questions and make suggestions.  [place] 04/09/2016, 10:12 PM

## 2016-04-09 NOTE — ED Notes (Signed)
Pt was compliant with his medications today but he is very withdrawn.  I have encouraged him multiple times to shower.  He states he is having audio hallucinations telling him to do bad things.  He also states he does not feel well physically but would not elaborate.  15 minute checks and video monitoring in place.

## 2016-04-09 NOTE — BH Assessment (Signed)
[employer] Assessment Progress Note  Per Corena Pilgrim, MD, this pt requires psychiatric hospitalization at this time.  Ria Comment, RN, [facility] has assigned pt to [facility] Rm [id number]; they will be ready to receive pt at 18:15.  Pt presents under IVC initiated by his sister and upheld by Dr Darleene Cleaver, and IVC documents have been faxed to [facility].  Pt's nurse, Nena Jordan, has been notified, and agrees to call report to [phone].  Pt is to be transported via [facility].Jalene Mullet, [place] Triage Specialist [phone]

## 2016-04-09 NOTE — Consult Note (Signed)
[place] [facility] Consult   Reason for Consult:  Suicidal ideations Referring Physician:  EDP Patient Identification: Ryan Choi MRN:  [id number] Principal Diagnosis: Schizoaffective disorder, bipolar type [facility]) Diagnosis:   Patient Active Problem List   Diagnosis Date Noted  . Schizoaffective disorder, bipolar type ([place]) [F25.0] 04/23/2015    Priority: High  . Hyperprolactinemia ([place]) [E22.1] 04/26/2015  . HTN (hypertension) [I10] 04/24/2015  . Hyperlipidemia [E78.5] 04/24/2015  . Cocaine use disorder, severe, dependence ([place]) [F14.20] 04/24/2015  . Alcohol use disorder, moderate, dependence ([place]) [F10.20] 04/24/2015  . Cannabis use disorder, severe, dependence ([place]) [F12.20] 04/24/2015  . Tobacco use disorder [F17.200] 04/24/2015  . ERECTILE DYSFUNCTION [F52.8] 06/05/2007    Total Time spent with patient: 45 minutes  Subjective:   Ryan Choi is a 45 y.o. male patient admitted with suicidal ideations and substance abuse, plan to overdose.  HPI:  ON admission:  45 y.o. male presents to [facility] under IVC by his sister Ryan Choi [phone].  IVC states:  Danger to self. Respondent is diagnosed with Schizophrenia and is prescribed medication for that condition but does not take medication as prescribed.  Was found with a pocket full of pills that were not his medication. Petitioner believes he uses crack and weed in addition to unknown pills, on a regular basis. Respondent has been previously committed at mental health, behavioral health, and [facility]. Respondent has been hearing voices. "I feel like doing something to myself, I feel like hurting myself" he has also threatened to hurt other individuals that have tried to help him.   Patient states that "she thought I spent $217 on crack in three days, but that's not true, I got a $20 bag and smoked a little bit of weed but I have been drinking. I've been off my medicine."  Patient states that he is does not  know what medications he is prescribed and states that he has not taken them since July 14 but states that he has gotten a shot about three weeks ago but thinks that he needs another one.  Patient states that he is getting prolixin via shot but states "it's not working." Patient states that he would like to get back on his medications. Patient denies SI and history of attempts. Patient denies statements about hurting himself on the IVC. Patient denies HI and states that he thought about hurting someone while in the [facility] program in [place]. Patient states that he has had auditory hallucinations since 1994. When asked about the nature of the voices he reports that the voices "tell me to do things" and when asked about the type of things he states "it depends on how mad the person makes me, the voices tell me to do bad things." patient states that he also thinks that he has "visions" of things that have not happened yet stating, "one time I had a dream that somebody was chasing me, but it really happened." Patient states that he uses "crack" cocaine "when I want to have sex" and states that he uses it about twice a month. Patient states that he uses THC daily. Patient states that he previously drank "all day everyday" but states that he no longer likes the taste of alcohol and does not drink that often. Patient denies allegations of IVC regarding medications states "I don't take pills unless it's my pills, I don;t like pills and stuff like that." Patient UDS + cocaine, + THC. Patient BAL 9 at time of assessment.  Today, patient has been homeless for the past 3 months since leaving [employer].  He has not been on his medications and started back using drugs.  Today, he continues to endorse auditory hallucinations with depression and suicidal ideations.  Denies withdrawal symptoms.  Past Psychiatric History: schizoaffective disorder, bipolar type  Risk to Self: Suicidal Ideation:  No Suicidal Intent: No Is patient at risk for suicide?: No Suicidal Plan?: No Access to Means: No What has been your use of drugs/alcohol within the last 12 months?: Denies How many times?: 0 Other Self Harm Risks: Denies Triggers for Past Attempts: None known Intentional Self Injurious Behavior: None Risk to Others: Homicidal Ideation: No Thoughts of Harm to Others: No Current Homicidal Intent: No Current Homicidal Plan: No Access to Homicidal Means: No Identified Victim: Denies History of harm to others?: No Assessment of Violence: None Noted Violent Behavior Description: Denies Does patient have access to weapons?: No Criminal Charges Pending?: No Does patient have a court date: No Prior Inpatient Therapy: Prior Inpatient Therapy: Yes Prior Therapy Dates: 1996 Prior Therapy Facilty/Provider(s): [place] Reason for Treatment: "mental health, substance abuse" Prior Outpatient Therapy: Prior Outpatient Therapy: Yes Prior Therapy Dates: Last year Prior Therapy Facilty/Provider(s): [facility] Reason for Treatment: "everything" Does patient have an ACCT team?: No Does patient have Intensive In-House Services?  : No Does patient have [facility] services? : No Does patient have P4CC services?: No  Past Medical History:  Past Medical History:  Diagnosis Date  . Hypercholesteremia   . Hypertension   . Schizo affective schizophrenia [facility])     Past Surgical History:  Procedure Laterality Date  . CHOLECYSTECTOMY     Family History:  Family History  Problem Relation Age of Onset  . Hypertension Mother   . Diabetes Mother   . Mental illness Neg Hx    Family Psychiatric  History: none Social History:  History  Alcohol Use  . 1.2 oz/week  . 2 Cans of beer per week     History  Drug Use  . Types: Cocaine, Marijuana    Social History   Social History  . Marital status: Single    Spouse name: N/A  . Number of children: N/A  . Years of education: N/A   Social  History Main Topics  . Smoking status: Current Every Day Smoker    Packs/day: 0.50    Types: Cigarettes  . Smokeless tobacco: Never Used  . Alcohol use 1.2 oz/week    2 Cans of beer per week  . Drug use:     Types: Cocaine, Marijuana  . Sexual activity: Not Asked   Other Topics Concern  . None   Social History Narrative  . None   Additional Social History:    Allergies:   Allergies  Allergen Reactions  . Ibuprofen     Flu symptoms   . Omeprazole     Makes pt agitated   . Tylenol [Acetaminophen]     Flu like symptoms     Labs:  Results for orders placed or performed during the hospital encounter of 04/08/16 (from the past 48 hour(s))  Rapid urine drug screen (hospital performed)     Status: Abnormal   Collection Time: 04/08/16  8:10 PM  Result Value Ref Range   Opiates NONE DETECTED NONE DETECTED   Cocaine POSITIVE (A) NONE DETECTED   Benzodiazepines NONE DETECTED NONE DETECTED   Amphetamines NONE DETECTED NONE DETECTED   Tetrahydrocannabinol POSITIVE (A) NONE DETECTED   Barbiturates NONE  DETECTED NONE DETECTED    Comment:        DRUG SCREEN FOR MEDICAL PURPOSES ONLY.  IF CONFIRMATION IS NEEDED FOR ANY PURPOSE, NOTIFY LAB WITHIN 5 DAYS.        LOWEST DETECTABLE LIMITS FOR URINE DRUG SCREEN Drug Class       Cutoff (ng/mL) Amphetamine      1000 Barbiturate      200 Benzodiazepine   268 Tricyclics       341 Opiates          300 Cocaine          300 THC              50   Comprehensive metabolic panel     Status: Abnormal   Collection Time: 04/08/16  8:29 PM  Result Value Ref Range   Sodium 138 135 - 145 mmol/L   Potassium 3.3 (L) 3.5 - 5.1 mmol/L   Chloride 104 101 - 111 mmol/L   CO2 25 22 - 32 mmol/L   Glucose, Bld 78 65 - 99 mg/dL   BUN 10 6 - 20 mg/dL   Creatinine, Ser 0.86 0.61 - 1.24 mg/dL   Calcium 8.9 8.9 - 10.3 mg/dL   Total Protein 7.5 6.5 - 8.1 g/dL   Albumin 4.2 3.5 - 5.0 g/dL   AST 17 15 - 41 U/L   ALT 16 (L) 17 - 63 U/L   Alkaline  Phosphatase 67 38 - 126 U/L   Total Bilirubin 0.7 0.3 - 1.2 mg/dL   GFR calc non Af Amer >60 >60 mL/min   GFR calc Af Amer >60 >60 mL/min    Comment: (NOTE) The eGFR has been calculated using the CKD EPI equation. This calculation has not been validated in all clinical situations. eGFR's persistently <60 mL/min signify possible Chronic Kidney Disease.    Anion gap 9 5 - 15  Ethanol     Status: Abnormal   Collection Time: 04/08/16  8:29 PM  Result Value Ref Range   Alcohol, Ethyl (B) 9 (H) <5 mg/dL    Comment:        LOWEST DETECTABLE LIMIT FOR SERUM ALCOHOL IS 5 mg/dL FOR MEDICAL PURPOSES ONLY   Salicylate level     Status: None   Collection Time: 04/08/16  8:29 PM  Result Value Ref Range   Salicylate Lvl <9.6 2.8 - 30.0 mg/dL  Acetaminophen level     Status: Abnormal   Collection Time: 04/08/16  8:29 PM  Result Value Ref Range   Acetaminophen (Tylenol), Serum <10 (L) 10 - 30 ug/mL    Comment:        THERAPEUTIC CONCENTRATIONS VARY SIGNIFICANTLY. A RANGE OF 10-30 ug/mL MAY BE AN EFFECTIVE CONCENTRATION FOR MANY PATIENTS. HOWEVER, SOME ARE BEST TREATED AT CONCENTRATIONS OUTSIDE THIS RANGE. ACETAMINOPHEN CONCENTRATIONS >150 ug/mL AT 4 HOURS AFTER INGESTION AND >50 ug/mL AT 12 HOURS AFTER INGESTION ARE OFTEN ASSOCIATED WITH TOXIC REACTIONS.   cbc     Status: Abnormal   Collection Time: 04/08/16  8:29 PM  Result Value Ref Range   WBC 11.1 (H) 4.0 - 10.5 K/uL   RBC 5.44 4.22 - 5.81 MIL/uL   Hemoglobin 14.5 13.0 - 17.0 g/dL   HCT 44.9 39.0 - 52.0 %   MCV 82.5 78.0 - 100.0 fL   MCH 26.7 26.0 - 34.0 pg   MCHC 32.3 30.0 - 36.0 g/dL   RDW 16.4 (H) 11.5 - 15.5 %   Platelets 307 150 -  400 K/uL    No current facility-administered medications for this encounter.    Current Outpatient Prescriptions  Medication Sig Dispense Refill  . buPROPion (WELLBUTRIN) 75 MG tablet Take 75 mg by mouth every morning.  0  . busPIRone (BUSPAR) 10 MG tablet Take 10 mg by mouth 2 (two)  times daily.  0  . fluPHENAZine decanoate (PROLIXIN) 25 MG/ML injection Inject 0.5 mLs (12.5 mg total) into the muscle every 14 (fourteen) days. 5 mL 0  . hydrOXYzine (ATARAX/VISTARIL) 25 MG tablet TAKE 2 TABLETS BY MOUTH AT BEDTIME FOR SLEEP  0  . meloxicam (MOBIC) 7.5 MG tablet Take 7.5 mg by mouth daily as needed. for pain  1  . mirtazapine (REMERON) 15 MG tablet Take 15 mg by mouth at bedtime.  0  . TEGRETOL 200 MG tablet Take 200 mg by mouth 2 (two) times daily.  1  . albuterol (PROVENTIL HFA;VENTOLIN HFA) 108 (90 BASE) MCG/ACT inhaler Inhale 1-2 puffs into the lungs every 6 (six) hours as needed for wheezing or shortness of breath. (Patient not taking: Reported on 04/22/2015) 1 Inhaler 0  . amantadine (SYMMETREL) 100 MG capsule Take 1 capsule (100 mg total) by mouth 2 (two) times daily. (Patient not taking: Reported on 04/08/2016) 60 capsule 0  . carbamazepine (TEGRETOL XR) 200 MG 12 hr tablet Take 1 tablet (200 mg total) by mouth 2 (two) times daily. (Patient not taking: Reported on 04/08/2016) 60 tablet 0  . fluPHENAZine (PROLIXIN) 5 MG tablet Take 1 tablet (5 mg total) by mouth every evening. (Patient not taking: Reported on 04/08/2016) 30 tablet 0  . hydrochlorothiazide (HYDRODIURIL) 25 MG tablet Take 1 tablet (25 mg total) by mouth daily. (Patient not taking: Reported on 04/08/2016) 30 tablet 0  . mirtazapine (REMERON) 7.5 MG tablet Take 1 tablet (7.5 mg total) by mouth at bedtime. (Patient not taking: Reported on 04/08/2016) 30 tablet 0  . nicotine (NICODERM CQ - DOSED IN MG/24 HOURS) 21 mg/24hr patch Place 1 patch (21 mg total) onto the skin daily. (Patient not taking: Reported on 04/08/2016) 28 patch 0    Musculoskeletal: Strength & Muscle Tone: within normal limits Gait & Station: normal Patient leans: N/A  Psychiatric Specialty Exam: Physical Exam  Constitutional: He is oriented to person, place, and time. He appears well-developed and well-nourished.  HENT:  Head: Normocephalic.  Neck:  Normal range of motion.  Respiratory: Effort normal.  Musculoskeletal: Normal range of motion.  Neurological: He is alert and oriented to person, place, and time.  Skin: Skin is warm and dry.  Psychiatric: His speech is normal. He is actively hallucinating. Cognition and memory are normal. He expresses impulsivity. He exhibits a depressed mood. He expresses suicidal ideation. He expresses suicidal plans.    Review of Systems  Constitutional: Negative.   HENT: Negative.   Eyes: Negative.   Respiratory: Negative.   Cardiovascular: Negative.   Gastrointestinal: Negative.   Genitourinary: Negative.   Musculoskeletal: Negative.   Skin: Negative.   Neurological: Negative.   Endo/Heme/Allergies: Negative.   Psychiatric/Behavioral: Positive for depression, hallucinations, substance abuse and suicidal ideas.    Blood pressure 157/89, pulse (!) 51, temperature 97.6 F (36.4 C), temperature source Oral, resp. rate 18, height _0  (1.727 m), SpO2 99 %.There is no height or weight on file to calculate BMI.  General Appearance: Disheveled  Eye Contact:  Fair  Speech:  Normal Rate  Volume:  Normal  Mood:  Depressed  Affect:  Blunt  Thought Process:  Coherent and  Descriptions of Associations: Intact  Orientation:  Full (Time, Place, and Person)  Thought Content:  Hallucinations: Auditory  Suicidal Thoughts:  Yes.  without intent/plan  Homicidal Thoughts:  No  Memory:  Immediate;   Fair Recent;   Fair Remote;   Fair  Judgement:  Fair  Insight:  Fair  Psychomotor Activity:  Decreased  Concentration:  Concentration: Fair and Attention Span: Fair  Recall:  [employer] of Knowledge:  Fair  Language:  Good  Akathisia:  No  Handed:  Right  AIMS (if indicated):     Assets:  Leisure Time Physical Health Resilience  ADL's:  Intact  Cognition:  WNL  Sleep:        Treatment Plan Summary: Daily contact with patient to assess and evaluate symptoms and progress in treatment, Medication  management and Plan schizoaffective disorder, bipolar type:  -Crisis stabilization -Medication management:  Medical medications started.  Symmetrel 100 mg BID for EPS, Tegretol 200 mg BID for mood stabilization, Prolixin 5 mg every am for psychosis, and Trazodone 100 mg at bedtime for sleep. -Individual and substance abuse counseling  Disposition: Recommend psychiatric Inpatient admission when medically cleared.  Waylan Boga, NP 04/09/2016 8:51 AM  Patient seen face-to-face for psychiatric evaluation, chart reviewed and case discussed with the physician extender and developed treatment plan. Reviewed the information documented and agree with the treatment plan. Corena Pilgrim, MD

## 2016-04-09 NOTE — ED Notes (Signed)
Pt discharged ambulatory with [facility].  Pt was in no distress at discharge.  All belongings were sent with patient.

## 2016-04-09 NOTE — Progress Notes (Signed)
Psychoeducational Group Note  Date:  04/09/2016 Time:  2138  Group Topic/Focus:  Wrap-Up Group:   The focus of this group is to help patients review their daily goal of treatment and discuss progress on daily workbooks.   Participation Level: Did Not Attend  Participation Quality:  Not Applicable  Affect:  Not Applicable  Cognitive:  Not Applicable  Insight:  Not Applicable  Engagement in Group: Not Applicable  Additional Comments:  The patient did not attend group this evening.    Archie Balboa S 04/09/2016, 9:38 PM
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
## 2016-04-09 NOTE — Progress Notes (Signed)
Patient ID: Ryan Choi, male   DOB: 1970/09/30, 45 y.o.   MRN: [id number] D: Pt is a new admission. Reports non compliant with medication for couple months. Pt reports multiple falls prior to admission. Pt endorses AVH without command to hurt self. Pt denies SI/HI and pain. A: medication administered as prescribed. Pt given walker for mobility. R: Pt is safe and resting in bed.

## 2016-04-09 NOTE — Progress Notes (Signed)
Ryan Choi is a 45 year old male being admitted involuntarily [facility] from [facility].  He was IVC'd by his sister Earlie Server for non compliance with medication, taking medications that aren't his and suicidal ideation.  He admits to using pot/cocaine/alcohol occasionally.  He recently received a shot of prolixin but it isn't helping.  He states that he was living at [facility] but currently homeless and staying with family at times.  He denies SI/HI.  He admits to hearing voices to tell him to do bad things.  He was pleasant and cooperative during the assessment.  He admitted to having a fall within the past 6 months and he was made a high fall risk.  He was ambulating well.  Admission paperwork completed and signed.  Belongings searched and secured in locker # 17.  Skin assessment completed and noted abrasion in right calf and scar on mid chest.  Q 15 minute checks initiated for safety.  We will monitor the progress towards his goals.

## 2016-04-10 DIAGNOSIS — F250 Schizoaffective disorder, bipolar type: Principal | ICD-10-CM

## 2016-04-10 DIAGNOSIS — F1420 Cocaine dependence, uncomplicated: Secondary | ICD-10-CM

## 2016-04-10 NOTE — Progress Notes (Signed)
Psychoeducational Group Note  Date:  04/10/2016 Time:  2146  Group Topic/Focus:  Wrap-Up Group:   The focus of this group is to help patients review their daily goal of treatment and discuss progress on daily workbooks.   Participation Level: Did Not Attend  Participation Quality:  Not Applicable  Affect:  Not Applicable  Cognitive:  Not Applicable  Insight:  Not Applicable  Engagement in Group: Not Applicable  Additional Comments: The patient did not attend group this evening.    Archie Balboa S 04/10/2016, 9:46 PM

## 2016-04-10 NOTE — H&P (Signed)
Psychiatric Admission Assessment Adult  Patient Identification: Ryan Choi MRN:  [id number] Date of Evaluation:  04/10/2016 Chief Complaint:  SCHIZOAFFECTIVE DISORDER, BIPOLAR TYPE ALCOHOL USE DISORDER,DEPENDENCE COCAINE USE DISORDER SEVERE,DEPENDENCE CANNABIS USE DISORDER SEVERE,DEPENDENCE Principal Diagnosis: Schizoaffective disorder, bipolar type ([place]) Diagnosis:   Patient Active Problem List   Diagnosis Date Noted  . Hyperprolactinemia ([place]) [E22.1] 04/26/2015  . HTN (hypertension) [I10] 04/24/2015  . Hyperlipidemia [E78.5] 04/24/2015  . Cocaine use disorder, moderate, dependence ([place]) [F14.20] 04/24/2015  . Alcohol use disorder, moderate, dependence ([place]) [F10.20] 04/24/2015  . Cannabis use disorder, severe, dependence ([place]) [F12.20] 04/24/2015  . Tobacco use disorder [F17.200] 04/24/2015  . Schizoaffective disorder, bipolar type ([place]) [F25.0] 04/23/2015  . ERECTILE DYSFUNCTION [F52.8] 06/05/2007   History of Present Illness:PER assessment Note:Danger to self. Respondent is diagnosed with Schizophrenia and is prescribed medication for that condition but does not take medication as prescribed.  Was found with a pocket full of pills that were not his medication. Petitioner believes he uses crack and weed in addition to unknown pills, on a regular basis. Respondent has been previously committed at mental health, behavioral health, and [facility]. Respondent has been hearing voices. "I feel like doing something to myself, I feel like hurting myself" he has also threatened to hurt other individuals that have tried to help him.   Patient states that "she thought I spent $217 on crack in three days, but that's not true, I got a $20 bag and smoked a little bit of weed but I have been drinking. I've been off my medicine."  Patient states that he is does not know what medications he is prescribed and states that he has not taken them since July 14 but states that he has gotten a shot about three  weeks ago but thinks that he needs another one.  Patient states that he is getting prolixin via shot but states "it's not working." Patient states that he would like to get back on his medications. Patient denies SI and history of attempts. Patient denies statements about hurting himself on the IVC. Patient denies HI and states that he thought about hurting someone while in the [facility] program in [place]. Patient states that he has had auditory hallucinations since 1994. When asked about the nature of the voices he reports that the voices "tell me to do things" and when asked about the type of things he states "it depends on how mad the person makes me, the voices tell me to do bad things." patient states that he also thinks that he has "visions" of things that have not happened yet stating, "one time I had a dream that somebody was chasing me, but it really happened." Patient states that he uses "crack" cocaine "when I want to have sex" and states that he uses it about twice a month. Patient states that he uses THC daily. Patient states that he previously drank "all day everyday" but states that he no longer likes the taste of alcohol and does not drink that often. Patient denies allegations of IVC regarding medications states "I don't take pills unless it's my pills, I don;t like pills and stuff like that." Patient UDS + cocaine, + THC. Patient BAL 9 at time of assessment.   On evaluation: Ryan Choi is awake, alert and oriented X4. Seen resting in bed. Denies suicidal or homicidal ideation at this time. Denies auditory or visual hallucination and does not appear to be responding to internal stimuli however states he hears  voice that are on and off.  Patient reports I don't need my medication all the time. Denies depression or depressive symptoms. Support, encouragement and reassurance was provided.   Associated Signs/Symptoms: Depression Symptoms:  depressed mood, difficulty  concentrating, hopelessness, (Hypo) Manic Symptoms:  Irritable Mood, Anxiety Symptoms:  Excessive Worry, Psychotic Symptoms:  Hallucinations: None PTSD Symptoms: Avoidance:  Decreased Interest/Participation Total Time spent with patient: 30 minutes  Past Psychiatric History:   Is the patient at risk to self? Yes.    Has the patient been a risk to self in the past 6 months? Yes.    Has the patient been a risk to self within the distant past? Yes.    Is the patient a risk to others? No.  Has the patient been a risk to others in the past 6 months? No.  Has the patient been a risk to others within the distant past? No.   Prior Inpatient Therapy:   Prior Outpatient Therapy:    Alcohol Screening: 1. How often do you have a drink containing alcohol?: Monthly or less 2. How many drinks containing alcohol do you have on a typical day when you are drinking?: 1 or 2 3. How often do you have six or more drinks on one occasion?: Never Preliminary Score: 0 9. Have you or someone else been injured as a result of your drinking?: No 10. Has a relative or friend or a doctor or another health worker been concerned about your drinking or suggested you cut down?: No Alcohol Use Disorder Identification Test Final Score (AUDIT): 1 Brief Intervention: AUDIT score less than 7 or less-screening does not suggest unhealthy drinking-brief intervention not indicated Substance Abuse History in the last 12 months:  Yes.   Consequences of Substance Abuse: Withdrawal Symptoms:   None Previous Psychotropic Medications: yes Psychological Evaluations: YES Past Medical History:  Past Medical History:  Diagnosis Date  . Hypercholesteremia   . Hypertension   . Schizo affective schizophrenia [facility])     Past Surgical History:  Procedure Laterality Date  . CHOLECYSTECTOMY     Family History:  Family History  Problem Relation Age of Onset  . Hypertension Mother   . Diabetes Mother   . Mental illness Neg Hx     Family Psychiatric  History: Unknown Tobacco Screening: Have you used any form of tobacco in the last 30 days? (Cigarettes, Smokeless Tobacco, Cigars, and/or Pipes): Yes Tobacco use, Select all that apply: 5 or more cigarettes per day Are you interested in Tobacco Cessation Medications?: Yes, will notify MD for an order Counseled patient on smoking cessation including recognizing danger situations, developing coping skills and basic information about quitting provided: Refused/Declined practical counseling Social History:  History  Alcohol Use  . 1.2 oz/week  . 2 Cans of beer per week     History  Drug Use  . Types: Cocaine, Marijuana    Additional Social History: Marital status: Single Does patient have children?: No                         Allergies:   Allergies  Allergen Reactions  . Ibuprofen     Flu symptoms   . Omeprazole     Makes pt agitated   . Tylenol [Acetaminophen]     Flu like symptoms    Lab Results:  Results for orders placed or performed during the hospital encounter of 04/08/16 (from the past 48 hour(s))  Rapid urine drug screen (hospital performed)  Status: Abnormal   Collection Time: 04/08/16  8:10 PM  Result Value Ref Range   Opiates NONE DETECTED NONE DETECTED   Cocaine POSITIVE (A) NONE DETECTED   Benzodiazepines NONE DETECTED NONE DETECTED   Amphetamines NONE DETECTED NONE DETECTED   Tetrahydrocannabinol POSITIVE (A) NONE DETECTED   Barbiturates NONE DETECTED NONE DETECTED    Comment:        DRUG SCREEN FOR MEDICAL PURPOSES ONLY.  IF CONFIRMATION IS NEEDED FOR ANY PURPOSE, NOTIFY LAB WITHIN 5 DAYS.        LOWEST DETECTABLE LIMITS FOR URINE DRUG SCREEN Drug Class       Cutoff (ng/mL) Amphetamine      1000 Barbiturate      200 Benzodiazepine   062 Tricyclics       376 Opiates          300 Cocaine          300 THC              50   Comprehensive metabolic panel     Status: Abnormal   Collection Time: 04/08/16  8:29 PM   Result Value Ref Range   Sodium 138 135 - 145 mmol/L   Potassium 3.3 (L) 3.5 - 5.1 mmol/L   Chloride 104 101 - 111 mmol/L   CO2 25 22 - 32 mmol/L   Glucose, Bld 78 65 - 99 mg/dL   BUN 10 6 - 20 mg/dL   Creatinine, Ser 0.86 0.61 - 1.24 mg/dL   Calcium 8.9 8.9 - 10.3 mg/dL   Total Protein 7.5 6.5 - 8.1 g/dL   Albumin 4.2 3.5 - 5.0 g/dL   AST 17 15 - 41 U/L   ALT 16 (L) 17 - 63 U/L   Alkaline Phosphatase 67 38 - 126 U/L   Total Bilirubin 0.7 0.3 - 1.2 mg/dL   GFR calc non Af Amer >60 >60 mL/min   GFR calc Af Amer >60 >60 mL/min    Comment: (NOTE) The eGFR has been calculated using the CKD EPI equation. This calculation has not been validated in all clinical situations. eGFR's persistently <60 mL/min signify possible Chronic Kidney Disease.    Anion gap 9 5 - 15  Ethanol     Status: Abnormal   Collection Time: 04/08/16  8:29 PM  Result Value Ref Range   Alcohol, Ethyl (B) 9 (H) <5 mg/dL    Comment:        LOWEST DETECTABLE LIMIT FOR SERUM ALCOHOL IS 5 mg/dL FOR MEDICAL PURPOSES ONLY   Salicylate level     Status: None   Collection Time: 04/08/16  8:29 PM  Result Value Ref Range   Salicylate Lvl <2.8 2.8 - 30.0 mg/dL  Acetaminophen level     Status: Abnormal   Collection Time: 04/08/16  8:29 PM  Result Value Ref Range   Acetaminophen (Tylenol), Serum <10 (L) 10 - 30 ug/mL    Comment:        THERAPEUTIC CONCENTRATIONS VARY SIGNIFICANTLY. A RANGE OF 10-30 ug/mL MAY BE AN EFFECTIVE CONCENTRATION FOR MANY PATIENTS. HOWEVER, SOME ARE BEST TREATED AT CONCENTRATIONS OUTSIDE THIS RANGE. ACETAMINOPHEN CONCENTRATIONS >150 ug/mL AT 4 HOURS AFTER INGESTION AND >50 ug/mL AT 12 HOURS AFTER INGESTION ARE OFTEN ASSOCIATED WITH TOXIC REACTIONS.   cbc     Status: Abnormal   Collection Time: 04/08/16  8:29 PM  Result Value Ref Range   WBC 11.1 (H) 4.0 - 10.5 K/uL   RBC 5.44 4.22 - 5.81 MIL/uL  Hemoglobin 14.5 13.0 - 17.0 g/dL   HCT 44.9 39.0 - 52.0 %   MCV 82.5 78.0 - 100.0  fL   MCH 26.7 26.0 - 34.0 pg   MCHC 32.3 30.0 - 36.0 g/dL   RDW 16.4 (H) 11.5 - 15.5 %   Platelets 307 150 - 400 K/uL    Blood Alcohol level:  Lab Results  Component Value Date   ETH 9 (H) 04/08/2016   ETH <5 05/39/7673    Metabolic Disorder Labs:  Lab Results  Component Value Date   HGBA1C 5.4 04/25/2015   MPG 108 04/25/2015   Lab Results  Component Value Date   PROLACTIN 19.3 (H) 04/25/2015   Lab Results  Component Value Date   CHOL 192 04/25/2015   TRIG 123 04/25/2015   HDL 41 04/25/2015   CHOLHDL 4.7 04/25/2015   VLDL 25 04/25/2015   LDLCALC 126 (H) 04/25/2015   LDLCALC 114 (H) 12/03/2009    Current Medications: Current Facility-Administered Medications  Medication Dose Route Frequency Provider Last Rate Last Dose  . albuterol (PROVENTIL HFA;VENTOLIN HFA) 108 (90 Base) MCG/ACT inhaler 1-2 puff  1-2 puff Inhalation Q6H PRN Patrecia Pour, NP      . alum & mag hydroxide-simeth (MAALOX/MYLANTA) 200-200-20 MG/5ML suspension 30 mL  30 mL Oral Q4H PRN Patrecia Pour, NP      . amantadine (SYMMETREL) capsule 100 mg  100 mg Oral BID Patrecia Pour, NP   100 mg at 04/10/16 0841  . carbamazepine (TEGRETOL XR) 12 hr tablet 200 mg  200 mg Oral BID Patrecia Pour, NP   200 mg at 04/10/16 0841  . fluPHENAZine (PROLIXIN) tablet 5 mg  5 mg Oral QPM Patrecia Pour, NP      . Derrill Memo ON 04/23/2016] fluPHENAZine decanoate (PROLIXIN) injection 12.5 mg  12.5 mg Intramuscular Q14 Days Patrecia Pour, NP      . hydrochlorothiazide (HYDRODIURIL) tablet 25 mg  25 mg Oral Daily Patrecia Pour, NP   25 mg at 04/10/16 0841  . magnesium hydroxide (MILK OF MAGNESIA) suspension 30 mL  30 mL Oral Daily PRN Patrecia Pour, NP      . nicotine (NICODERM CQ - dosed in mg/24 hours) patch 21 mg  21 mg Transdermal Daily Ursula Alert, MD   21 mg at 04/10/16 0840  . traZODone (DESYREL) tablet 100 mg  100 mg Oral QHS,MR X 1 Spencer E Simon, PA-C       PTA Medications: Prescriptions Prior to Admission   Medication Sig Dispense Refill Last Dose  . albuterol (PROVENTIL HFA;VENTOLIN HFA) 108 (90 BASE) MCG/ACT inhaler Inhale 1-2 puffs into the lungs every 6 (six) hours as needed for wheezing or shortness of breath. (Patient not taking: Reported on 04/22/2015) 1 Inhaler 0 Not Taking at Unknown time  . amantadine (SYMMETREL) 100 MG capsule Take 1 capsule (100 mg total) by mouth 2 (two) times daily. (Patient not taking: Reported on 04/08/2016) 60 capsule 0 Completed Course at Unknown time  . buPROPion (WELLBUTRIN) 75 MG tablet Take 75 mg by mouth every morning.  0 Past Month at Unknown time  . busPIRone (BUSPAR) 10 MG tablet Take 10 mg by mouth 2 (two) times daily.  0 Past Month at Unknown time  . carbamazepine (TEGRETOL XR) 200 MG 12 hr tablet Take 1 tablet (200 mg total) by mouth 2 (two) times daily. (Patient not taking: Reported on 04/08/2016) 60 tablet 0 Completed Course at Unknown time  . fluPHENAZine (PROLIXIN)  5 MG tablet Take 1 tablet (5 mg total) by mouth every evening. (Patient not taking: Reported on 04/08/2016) 30 tablet 0 Completed Course at Unknown time  . fluPHENAZine decanoate (PROLIXIN) 25 MG/ML injection Inject 0.5 mLs (12.5 mg total) into the muscle every 14 (fourteen) days. 5 mL 0 Past Month at Unknown time  . hydrochlorothiazide (HYDRODIURIL) 25 MG tablet Take 1 tablet (25 mg total) by mouth daily. (Patient not taking: Reported on 04/08/2016) 30 tablet 0 Completed Course at Unknown time  . hydrOXYzine (ATARAX/VISTARIL) 25 MG tablet TAKE 2 TABLETS BY MOUTH AT BEDTIME FOR SLEEP  0 Past Month at Unknown time  . meloxicam (MOBIC) 7.5 MG tablet Take 7.5 mg by mouth daily as needed. for pain  1 Past Month at Unknown time  . mirtazapine (REMERON) 15 MG tablet Take 15 mg by mouth at bedtime.  0 Past Month at Unknown time  . mirtazapine (REMERON) 7.5 MG tablet Take 1 tablet (7.5 mg total) by mouth at bedtime. (Patient not taking: Reported on 04/08/2016) 30 tablet 0 Completed Course at Unknown time  .  nicotine (NICODERM CQ - DOSED IN MG/24 HOURS) 21 mg/24hr patch Place 1 patch (21 mg total) onto the skin daily. (Patient not taking: Reported on 04/08/2016) 28 patch 0 Completed Course at Unknown time  . TEGRETOL 200 MG tablet Take 200 mg by mouth 2 (two) times daily.  1 Past Month at Unknown time    Musculoskeletal: Strength & Muscle Tone: within normal limits Gait & Station: use walker for ambulation Patient leans: N/A  Psychiatric Specialty Exam: Physical Exam  ROS  Blood pressure 122/86, pulse 88, temperature 98 F (36.7 C), temperature source Oral, resp. rate 16, height '5\' 8"'  (1.727 m), weight 88.9 kg (196 lb), SpO2 100 %.Body mass index is 29.8 kg/m.  General Appearance: Casual  Eye Contact:  Minimal  Speech:  Clear and Coherent  Volume:  Normal  Mood:  Anxious and Depressed  Affect:  Congruent  Thought Process:  Coherent  Orientation:  Full (Time, Place, and Person)  Thought Content:  Hallucinations: None  Suicidal Thoughts:  No  Homicidal Thoughts:  No  Memory:  Immediate;   Fair Remote;   Fair  Judgement:  Fair  Insight:  Lacking  Psychomotor Activity:  Normal  Concentration:  Concentration: Fair  Recall:  Good  Fund of Knowledge:  Fair  Language:  Fair  Akathisia:  No  Handed:  Right  AIMS (if indicated):     Assets:  Communication Skills Desire for Improvement Housing Social Support  ADL's:  Intact  Cognition:  WNL  Sleep:  Number of Hours: 6      I agree with current treatment plan on 04/10/2016, Patient seen face-to-face for psychiatric evaluation follow-up, chart reviewed and case discussed with the MD De Nurse.. Reviewed the information documented and agree with the treatment plan.   Treatment Plan Summary: Daily contact with patient to assess and evaluate symptoms and progress in treatment and Medication management  Daily contact with patient to assess and evaluate symptoms and progress in treatment, Medication management and Plan schizoaffective  disorder, bipolar type:  -Crisis stabilization -Medication management:  Continue Symmetrel 100 mg BID for EPS, Tegretol 200 mg BID for mood stabilization, Prolixin 5 mg every am for psychosis, and  Trazodone 100 mg at bedtime for insomnia -Individual and substance abuse counseling  Observation Level/Precautions:  15 minute checks  Laboratory:  CBC Chemistry Profile UDS UA  Psychotherapy:    Medications:    Consultations:  Discharge Concerns:    Estimated LOS:  Other:     I certify that inpatient services furnished can reasonably be expected to improve the patient's condition.    Derrill Center, NP 8/5/20175:40 PM  I have examined the patient and agree with the discharge plan and findings. I have also done suicide assessment on this patient.

## 2016-04-10 NOTE — [facility] Counselor (Signed)
Adult Comprehensive Assessment  Patient ID: Ryan Choi, male   DOB: 1971-05-21, 45 y.o.   MRN: [id number]  Information Source: Information source: Patient  Current Stressors:  Employment / Job issues: on disability Family Relationships: not close to any family members Museum/gallery curator / Lack of resources (include bankruptcy): on fixed income, depends on [employer] / Lack of housing: homeless Substance abuse: crack cocaine, marijuana and alcohol abuse  Living/Environment/Situation:  Living Arrangements: Other (Comment) Living conditions (as described by patient or guardian): Pt reports being homeless, sleeping under bridges or at hospitals.  How long has patient lived in current situation?: 3-4 months What is atmosphere in current home: Dangerous, Temporary  Family History:  Marital status: Single Does patient have children?: No  Childhood History:  By whom was/is the patient raised?: Mother Additional childhood history information: Pt reports his mother raised him and his childhood was okay.   Description of patient's relationship with caregiver when they were a child: Pt reports being close to mother growing up Patient's description of current relationship with people who raised him/her: Pt reports not being close to mother today How were you disciplined when you got in trouble as a child/adolescent?: "Whipped" Does patient have siblings?: Yes Number of Siblings: 4 Description of patient's current relationship with siblings: pt reports not being close to any of them Did patient suffer any verbal/emotional/physical/sexual abuse as a child?: No Did patient suffer from severe childhood neglect?: No Has patient ever been sexually abused/assaulted/raped as an adolescent or adult?: No Was the patient ever a victim of a crime or a disaster?: No Witnessed domestic violence?: No Has patient been effected by domestic violence as an adult?: No  Education:  Highest grade of school  patient has completed: 3 years of college Currently a student?: No Learning disability?: No  Employment/Work Situation:   Employment situation: On disability Why is patient on disability: Schizoaffective disorder How long has patient been on disability: since 2011 What is the longest time patient has a held a job?: 5 years 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 Has patient ever been in the [employer]?: No Has patient ever served in combat?: No Did You Receive Any Psychiatric Treatment/Services While in Passenger transport manager?: No Are There Guns or Other Weapons in [place]?: No  Financial Resources:   Financial resources: Teacher, early years/pre Does patient have a Programmer, applications or guardian?: No  Alcohol/Substance Abuse:   What has been your use of drugs/alcohol within the last 12 months?: Crack cocaine - 1-2 times per month, $10-20 per use, Marijuana - 1 time per week, 1 blunt per use, Alcohol - 1-2 times per month, 1-2 beers per use If attempted suicide, did drugs/alcohol play a role in this?: No Alcohol/Substance Abuse Treatment Hx: Past Tx, Inpatient If yes, describe treatment: rehab in 1996 Has alcohol/substance abuse ever caused legal problems?: No  Social Support System:   Pensions consultant Support System: Fair Describe Community Support System: Pt denies having support but admits sister was concerned about his well being for valid reasons Type of faith/religion: Darrick Meigs How does patient's faith help to cope with current illness?: prayer  Leisure/Recreation:   Leisure and Hobbies: used to enjoy baseball  Strengths/Needs:   What things does the patient do well?: "don't know" In what areas does patient struggle / problems for patient: off meds  Discharge Plan:   Does patient have access to transportation?: Yes (needs bus pass) Will patient be returning to same living situation after discharge?: Yes (homeless)  Currently receiving community mental health services:  No If no, would patient like referral for services when discharged?: Yes (What county?) [facility]) Does patient have financial barriers related to discharge medications?: No  Summary/Recommendations:   Summary and Recommendations (to be completed by the evaluator): Patient is a 45 year old male, with a diagnosis of Schizophrenia, on admission.  Patient presented to the hospital due non compliance of meds.  Patient reports primary trigger for admission was his sister being concerned about his well being, due to being off of his meds. Patient will benefit from crisis stabilization, medication evaluation, group therapy and psycho education in addition to case management for discharge planning. At discharge, it is recommended that patient remain compliant with established discharge plan and continued treatment.    Pt presents with calm mood and affect.  Pt is currently homeless in [place].  Pt states that he was at [facility] for 11.5 months and was going to graduate, but left due to issues with his payee.  Pt reports being homeless between [place] and [place] for the last 3-4 months, staying under bridges or at hospitals.  Pt states that he'd like to go to [place] where he has friends.  Pt denies having a guardian, just a payee.  Pt states that he had a payee but she was stealing his money and owes him $6,000.  Pt states that his sister is now his payee and also won't give him money, so he plans to take her to court after discharge.  Pt is open to following up at [facility] for medication management and therapy after discharge, where he plans to stay until he sorts out his payee, at which point he plans to relocate to [place].    Discharge Process and Patient Expectations information sheet signed by patient, witnessed by writer and inserted in patient's shadow chart.  Pt is not a smoker but is not interested in [place] [facility] at discharge.    Magdalen Spatz. 04/10/2016

## 2016-04-10 NOTE — BHH Suicide Risk Assessment (Signed)
[facility] Admission Suicide Risk Assessment   Nursing information obtained from:  Patient Demographic factors:  Male, Unemployed, Low socioeconomic status Current Mental Status:  NA Loss Factors:  Financial problems / change in socioeconomic status Historical Factors:  Impulsivity Risk Reduction Factors:  NA  Total Time spent with patient: 1.5 hours Principal Problem: <principal problem not specified> Diagnosis:   Patient Active Problem List   Diagnosis Date Noted  . Hyperprolactinemia ([place]) [E22.1] 04/26/2015  . HTN (hypertension) [I10] 04/24/2015  . Hyperlipidemia [E78.5] 04/24/2015  . Cocaine use disorder, severe, dependence ([place]) [F14.20] 04/24/2015  . Alcohol use disorder, moderate, dependence ([place]) [F10.20] 04/24/2015  . Cannabis use disorder, severe, dependence ([place]) [F12.20] 04/24/2015  . Tobacco use disorder [F17.200] 04/24/2015  . Schizoaffective disorder, bipolar type ([place]) [F25.0] 04/23/2015  . ERECTILE DYSFUNCTION [F52.8] 06/05/2007   Subjective Data: Somewhat vague in history. States to be non compliant with meds and got back into drugs. Feeling low. Poor sleep  Continued Clinical Symptoms:  Alcohol Use Disorder Identification Test Final Score (AUDIT): 1 The "Alcohol Use Disorders Identification Test", Guidelines for Use in Primary Care, Second Edition.  World Pharmacologist [facility]). Score between 0-7:  no or low risk or alcohol related problems. Score between 8-15:  moderate risk of alcohol related problems. Score between 16-19:  high risk of alcohol related problems. Score 20 or above:  warrants further diagnostic evaluation for alcohol dependence and treatment.   CLINICAL FACTORS:   Bipolar Disorder:   Depressive phase Alcohol/Substance Abuse/Dependencies More than one psychiatric diagnosis Currently Psychotic Unstable or Poor Therapeutic Relationship   Musculoskeletal: Strength & Muscle Tone: within normal limits Gait & Station: normal Patient leans: no  lean  Psychiatric Specialty Exam: Physical Exam  Constitutional: He appears well-developed.    Review of Systems  Cardiovascular: Negative for chest pain.  Skin: Negative for rash.  Psychiatric/Behavioral: Positive for depression and substance abuse. The patient is nervous/anxious.     Blood pressure 122/86, pulse 88, temperature 98 F (36.7 C), temperature source Oral, resp. rate 16, height 5\' 8"  (1.727 m), weight 88.9 kg (196 lb), SpO2 100 %.Body mass index is 29.8 kg/m.  General Appearance: Casual  Eye Contact:  Fair  Speech:  Slow  Volume:  Decreased  Mood:  Dysphoric  Affect:  Constricted and Depressed  Thought Process:  Goal Directed  Orientation:  Full (Time, Place, and Person)  Thought Content:  Rumination  Suicidal Thoughts:  No  Homicidal Thoughts:  No  Memory:  Immediate;   Fair Recent;   Poor  Judgement:  Poor  Insight:  Shallow  Psychomotor Activity:  Decreased  Concentration:  Concentration: Fair and Attention Span: Fair  Recall:  [employer] of Knowledge:  Fair  Language:  Fair  Akathisia:  Negative  Handed:  Right  AIMS (if indicated):     Assets:  Desire for Improvement  ADL's:  Intact  Cognition:  WNL  Sleep:  Number of Hours: 6      COGNITIVE FEATURES THAT CONTRIBUTE TO RISK:  Closed-mindedness and Polarized thinking    SUICIDE RISK:   Moderate:  Frequent suicidal ideation with limited intensity, and duration, some specificity in terms of plans, no associated intent, good self-control, limited dysphoria/symptomatology, some risk factors present, and identifiable protective factors, including available and accessible social support.   PLAN OF CARE: Admit for stabilization. Supportive therapy and counselling and detox if needed off drugs. Medication management.   I certify that inpatient services furnished can reasonably be expected to improve the  patient's condition.  Merian Capron, MD 04/10/2016, 9:55 AM

## 2016-04-10 NOTE — BHH Counselor (Signed)
CSW attempted to meet with pt to complete PSA but pt was sleeping and did not respond to wake up.  CSW will attempt at a later time.    Theressa Millard, [place] 04/10/2016  11:33 AM

## 2016-04-10 NOTE — Progress Notes (Signed)
DAR Note: Ryan Choi has been in bed all morning.  He didn't get up for meals.  He refused to come to groups.  He did get up to take morning medications but went straight back to bed.  No interaction with peers.  He refused to complete his self inventory even after much encouragement.  He continues to voice hearing voices and they are telling him to hurt himself but stated that he ignores them.  He denies SI/HI.  He denies pain and appears to be in no physical distress.  He does use a walker given to him last night because he was unsteady.  He was steady walking to the med room this morning with the walker.  He remains a fall risk due to his history of falling prior to admission.  Encouraged  participation in group and unit activities.  Q 15 minute checks maintained for safety.  We will continue to monitor the progress towards his goals.

## 2016-04-10 NOTE — BHH Group Notes (Signed)
[place] Group Notes:  (Nursing/MHT/Case Management/Adjunct)  Date:  04/10/2016  Time:  1030am  Type of Therapy:  Nurse Education  Participation Level:  Did Not Attend  Participation Quality:    Affect:    Cognitive:    Insight:    Engagement in Group:  Chose not to attend even after much encouragement  Modes of Intervention:  Discussion and Education  Summary of Progress/Problems:  Ryan Choi 04/10/2016, 10:59 AM

## 2016-04-10 NOTE — BHH Group Notes (Signed)
[place] Group Notes: (Clinical Social Work)   04/10/2016      Type of Therapy:  Group Therapy   Participation Level:  Did Not Attend despite MHT prompting   Selmer Dominion, LCSW 04/10/2016, 4:54 PM

## 2016-04-11 MED ORDER — CLONIDINE HCL 0.1 MG PO TABS
0.1000 mg | ORAL_TABLET | Freq: Once | ORAL | Status: AC
  Administered 2016-04-11: 0.1 mg via ORAL
  Filled 2016-04-11 (×2): qty 1

## 2016-04-11 NOTE — Progress Notes (Signed)
[facility] MD Progress Note  04/11/2016 2:09 PM Ryan Choi  MRN:  [id number]  Subjective: Patient reports " I was able to get some rest on last night so I am feeling a lot better." Objective: Ryan Choi is awake, alert and oriented X4. Seen resting in bed.  Denies suicidal or homicidal ideation. Denies auditory or visual hallucination and does not appear to be responding to internal stimuli. However reports he hears voice sometimes. Patient reports he is medication compliant without mediation side effects. States he is trying to get caught up with his sleep. Patient reports he hasn't attended any group sessions.Reports good appetite other wise and resting well. Support, encouragement and reassurance was provided.    Principal Problem: Schizoaffective disorder, bipolar type ([place]) Diagnosis:   Patient Active Problem List   Diagnosis Date Noted  . Hyperprolactinemia ([place]) [E22.1] 04/26/2015  . HTN (hypertension) [I10] 04/24/2015  . Hyperlipidemia [E78.5] 04/24/2015  . Cocaine use disorder, moderate, dependence ([place]) [F14.20] 04/24/2015  . Alcohol use disorder, moderate, dependence ([place]) [F10.20] 04/24/2015  . Cannabis use disorder, severe, dependence ([place]) [F12.20] 04/24/2015  . Tobacco use disorder [F17.200] 04/24/2015  . Schizoaffective disorder, bipolar type ([place]) [F25.0] 04/23/2015  . ERECTILE DYSFUNCTION [F52.8] 06/05/2007   Total Time spent with patient: 30 minutes  Past Psychiatric History: See above  Past Medical History:  Past Medical History:  Diagnosis Date  . Hypercholesteremia   . Hypertension   . Schizo affective schizophrenia [facility])     Past Surgical History:  Procedure Laterality Date  . CHOLECYSTECTOMY     Family History:  Family History  Problem Relation Age of Onset  . Hypertension Mother   . Diabetes Mother   . Mental illness Neg Hx    Family Psychiatric  History: See Above Social History:  History  Alcohol Use  . 1.2 oz/week  . 2 Cans of beer per  week     History  Drug Use  . Types: Cocaine, Marijuana    Social History   Social History  . Marital status: Single    Spouse name: N/A  . Number of children: N/A  . Years of education: N/A   Social History Main Topics  . Smoking status: Current Every Day Smoker    Packs/day: 0.50    Types: Cigarettes  . Smokeless tobacco: Never Used  . Alcohol use 1.2 oz/week    2 Cans of beer per week  . Drug use:     Types: Cocaine, Marijuana  . Sexual activity: Not Asked   Other Topics Concern  . None   Social History Narrative  . None   Additional Social History:                         Sleep: Fair  Appetite:  Fair  Current Medications: Current Facility-Administered Medications  Medication Dose Route Frequency Provider Last Rate Last Dose  . albuterol (PROVENTIL HFA;VENTOLIN HFA) 108 (90 Base) MCG/ACT inhaler 1-2 puff  1-2 puff Inhalation Q6H PRN Patrecia Pour, NP      . alum & mag hydroxide-simeth (MAALOX/MYLANTA) 200-200-20 MG/5ML suspension 30 mL  30 mL Oral Q4H PRN Patrecia Pour, NP      . amantadine (SYMMETREL) capsule 100 mg  100 mg Oral BID Patrecia Pour, NP   100 mg at 04/11/16 0805  . carbamazepine (TEGRETOL XR) 12 hr tablet 200 mg  200 mg Oral BID Patrecia Pour, NP   200 mg at 04/11/16  [id number]  . fluPHENAZine (PROLIXIN) tablet 5 mg  5 mg Oral QPM Patrecia Pour, NP   5 mg at 04/10/16 1845  . [START ON 04/23/2016] fluPHENAZine decanoate (PROLIXIN) injection 12.5 mg  12.5 mg Intramuscular Q14 Days Patrecia Pour, NP      . hydrochlorothiazide (HYDRODIURIL) tablet 25 mg  25 mg Oral Daily Patrecia Pour, NP   25 mg at 04/11/16 0805  . magnesium hydroxide (MILK OF MAGNESIA) suspension 30 mL  30 mL Oral Daily PRN Patrecia Pour, NP      . nicotine (NICODERM CQ - dosed in mg/24 hours) patch 21 mg  21 mg Transdermal Daily Ursula Alert, MD   21 mg at 04/10/16 0840  . traZODone (DESYREL) tablet 100 mg  100 mg Oral QHS,MR X 1 Laverle Hobby, PA-C   100 mg at  04/10/16 2119    Lab Results: No results found for this or any previous visit (from the past 48 hour(s)).  Blood Alcohol level:  Lab Results  Component Value Date   ETH 9 (H) 04/08/2016   ETH <5 [id number]    Metabolic Disorder Labs: Lab Results  Component Value Date   HGBA1C 5.4 04/25/2015   MPG 108 04/25/2015   Lab Results  Component Value Date   PROLACTIN 19.3 (H) 04/25/2015   Lab Results  Component Value Date   CHOL 192 04/25/2015   TRIG 123 04/25/2015   HDL 41 04/25/2015   CHOLHDL 4.7 04/25/2015   VLDL 25 04/25/2015   LDLCALC 126 (H) 04/25/2015   LDLCALC 114 (H) 12/03/2009    Physical Findings: AIMS: Facial and Oral Movements Muscles of Facial Expression: None, normal Lips and Perioral Area: None, normal Jaw: None, normal Tongue: None, normal,Extremity Movements Upper (arms, wrists, hands, fingers): None, normal Lower (legs, knees, ankles, toes): None, normal, Trunk Movements Neck, shoulders, hips: None, normal, Overall Severity Severity of abnormal movements (highest score from questions above): None, normal Incapacitation due to abnormal movements: None, normal Patient's awareness of abnormal movements (rate only patient's report): No Awareness, Dental Status Current problems with teeth and/or dentures?: No Does patient usually wear dentures?: No  CIWA:    COWS:     Musculoskeletal: Strength & Muscle Tone: within normal limits Gait & Station: normal Patient leans: N/A  Psychiatric Specialty Exam: Physical Exam  Nursing note and vitals reviewed. Constitutional: He is oriented to person, place, and time. He appears well-developed.  Neck: Normal range of motion.  Musculoskeletal: Normal range of motion.  Neurological: He is alert and oriented to person, place, and time.  Skin: Skin is warm and dry.  Psychiatric: He has a normal mood and affect. His behavior is normal.    Review of Systems  Psychiatric/Behavioral: Positive for depression. Negative  for suicidal ideas. The patient is not nervous/anxious.     Blood pressure (!) 134/103, pulse 82, temperature 97.9 F (36.6 C), temperature source Oral, resp. rate 16, height 5\' 8"  (1.727 m), weight 88.9 kg (196 lb), SpO2 100 %.Body mass index is 29.8 kg/m.  General Appearance: Disheveled  Eye Contact:  Fair  Speech:  Clear and Coherent  Volume:  Normal  Mood:  Depressed  Affect:  Congruent  Thought Process:  Coherent  Orientation:  Full (Time, Place, and Person)  Thought Content:  Hallucinations: Auditory  Suicidal Thoughts:  No  Homicidal Thoughts:  No  Memory:  Immediate;   Fair Recent;   Fair Remote;   Fair  Judgement:  Fair  Insight:  Lacking  Psychomotor Activity:  Restlessness  Concentration:  Concentration: Fair  Recall:  [employer] of Knowledge:  Fair  Language:  Good  Akathisia:  No  Handed:  Right  AIMS (if indicated):     Assets:  Communication Skills Desire for Improvement Resilience Social Support  ADL's:  Intact  Cognition:  WNL  Sleep:  Number of Hours: 6.25     I agree with current treatment plan on 04/11/2016, Patient seen face-to-face for psychiatric evaluation follow-up, chart reviewed. Reviewed the information documented and agree with the treatment plan.  Treatment Plan Summary: Daily contact with patient to assess and evaluate symptoms and progress in treatment and Medication management  schizoaffective disorder, bipolar type: -Crisis stabilization -Medication management:  ContinueSymmetrel 100 mg BID for EPS, Tegretol 200 mg BID for mood stabilization, Prolixin 5 mg every am for psychosis, and  Trazodone 100 mg at bedtime for insomnia -Individual and substance abuse counseling Will continue to monitor vitals ,medication compliance and treatment side effects while patient is here.  Reviewed labs-orders placed for prolactin, A1c, lipid, EKG- baseline TSH  elevated ,BAL - 0, UDS - pos for cocaine, thc CSW will start working on disposition.   Patient to participate in therapeutic milieu  [place]ill [place], NP 04/11/2016, 2:09 PM  I agree with findings and treatment plan of this patient

## 2016-04-11 NOTE — Progress Notes (Signed)
Patient has been in his room asleep and did not attend group. He received his first dose of trazadone and was informed that he has a second dose if needed. He received a snack and returned to his room to rest. Safety maintained on unit with 15 min checks.

## 2016-04-11 NOTE — Progress Notes (Signed)
Psychoeducational Group Note  Date:  04/11/2016 Time:  2120  Group Topic/Focus:  Wrap-Up Group:   The focus of this group is to help patients review their daily goal of treatment and discuss progress on daily workbooks.   Participation Level: Did Not Attend  Participation Quality:  Not Applicable  Affect:  Not Applicable  Cognitive:  Not Applicable  Insight:  Not Applicable  Engagement in Group: Not Applicable  Additional Comments:  The patient did not attend group this evening as he remained in his bed.   Kaleeyah Cuffie S 04/11/2016, 9:20 PM

## 2016-04-11 NOTE — BHH Group Notes (Signed)
[place] Group Notes:  (Clinical Social Work)  04/11/2016  11:00AM-12:00PM  Summary of Progress/Problems:  The main focus of today's process group was to listen to a variety of genres of music and to identify that different types of music provoke different responses.  The patient then was able to identify personally what was soothing for them, as well as energizing, as well as how patient can personally use this knowledge in sleep habits, with depression, and with other symptoms.  The patient expressed at the beginning of group the overall feeling of sleepy, but he was attentive and smiled a great deal throughout group, particularly when he really liked a song.  Type of Therapy:  Music Therapy   Participation Level:  Active  Participation Quality:  Attentive   Affect:  Appropriate  Cognitive:  Oriented  Insight:  Engaged  Engagement in Therapy:  Engaged  Modes of Intervention:   Activity, Exploration  Selmer Dominion, LCSW 04/11/2016

## 2016-04-12 LAB — LIPID PANEL
Cholesterol: 197 mg/dL (ref 0–200)
HDL: 34 mg/dL — ABNORMAL LOW (ref >40)
LDL CALC: 130 mg/dL — AB (ref 0–99)
Total CHOL/HDL Ratio: 5.8 RATIO
Triglycerides: 166 mg/dL — ABNORMAL HIGH (ref <150)
VLDL: 33 mg/dL (ref 0–40)

## 2016-04-12 LAB — TSH: TSH: 1.187 u[IU]/mL (ref 0.350–4.500)

## 2016-04-12 MED ORDER — TRAZODONE HCL 100 MG PO TABS
100.0000 mg | ORAL_TABLET | Freq: Every evening | ORAL | Status: DC | PRN
  Administered 2016-04-12 – 2016-04-17 (×6): 100 mg via ORAL
  Filled 2016-04-12 (×5): qty 1

## 2016-04-12 MED ORDER — FLUPHENAZINE HCL 10 MG PO TABS
10.0000 mg | ORAL_TABLET | Freq: Every evening | ORAL | Status: DC
  Administered 2016-04-12 – 2016-04-17 (×6): 10 mg via ORAL
  Filled 2016-04-12 (×8): qty 1

## 2016-04-12 MED ORDER — MIRTAZAPINE 7.5 MG PO TABS
7.5000 mg | ORAL_TABLET | Freq: Every day | ORAL | Status: DC
  Administered 2016-04-12 – 2016-04-15 (×4): 7.5 mg via ORAL
  Filled 2016-04-12 (×7): qty 1

## 2016-04-12 MED ORDER — FLUPHENAZINE DECANOATE 25 MG/ML IJ SOLN: 25.0000 mg | INTRAMUSCULAR | Status: DC

## 2016-04-12 NOTE — Tx Team (Signed)
Interdisciplinary Treatment Plan Update (Adult)  Date:  04/12/2016   Time Reviewed:  8:16 AM   Progress in Treatment: Attending groups: No Participating in groups: No Taking medication as prescribed:  Yes. Tolerating medication:  Yes. Family/Significant other contact made:  No Patient understands diagnosis:  Yes  As evidenced by seeking help with "these voices, and I can't sleep" Discussing patient identified problems/goals with staff:  Yes, see initial care plan. Medical problems stabilized or resolved:  Yes. Denies suicidal/homicidal ideation: Yes. Issues/concerns per patient self-inventory:  No. Other:  New problem(s) identified:  Discharge Plan or Barriers: see below  Reason for Continuation of Hospitalization: Depression Hallucinations Medication stabilization  Comments:  Pt arrives to the ER under IVC; pt's sister took out IVC stating that pt is not taking his medication as prescribed; pt has been found with his pockets full of medications that were not prescribed to patient; sister reports that pt has been uses crack and marijuana and other unknown "pills"; sister reports that pt has been hearing voices and has been making statements "I feel like hurting myself"; pt denies SI / HI in triage; pt states that he has not been taking his medication; pt has also made statements that he has thoughts of hurting people that have tried to help him  Will increase Prolixin to 10 mg po qhs for psychosis/mood sx. Will increase Prolixin decanoate to 25 mg IM q14 days - next dose 04/23/16. Will continue Amantadine 100 mg po bid for side effects of prolixin. Continue Tegretol 200 mg po bid for mood sx. Tegretol level on 04/13/16. Will change Trazodone to 100 mg po qhs prn for sleep. Will add Remeron 7.5 mg po qhs for sleep.  Estimated length of stay: 4-5 days  New goal(s):  Review of initial/current patient goals per problem list:   Review of initial/current patient goals per problem  list:  1. Goal(s): Patient will participate in aftercare plan   Met: Yes   Target date: 3-5 days post admission date   As evidenced by: Patient will participate within aftercare plan AEB aftercare provider and housing plan at discharge being identified. 04/12/16:  Return home, follow up outpt   2. Goal (s): Patient will exhibit decreased depressive symptoms and suicidal ideations.   Met: No   Target date: 3-5 days post admission date   As evidenced by: Patient will utilize self rating of depression at 3 or below and demonstrate decreased signs of depression or be deemed stable for discharge by MD. 04/12/16: Rates depression a 6 today       4. Goal(s): Patient will demonstrate decreased signs of withdrawal due to substance abuse   Met: Yes   Target date: 3-5 days post admission date   As evidenced by: Patient will produce a CIWA/COWS score of 0, have stable vitals signs, and no symptoms of withdrawal 04/12/16:  No sign nor symptoms of withdrawal today    5. Goal(s): Patient will demonstrate decreased signs of psychosis  * Met: No  * Target date: 3-5 days post admission date  * As evidenced by: Patient will demonstrate decreased frequency of AVH or return to baseline function 04/12/16:  Pt c/o AH today, to the extent that he is unable to sleep          Attendees: Patient:  04/12/2016 8:16 AM   Family:   04/12/2016 8:16 AM   Physician:  Ursula Alert, MD 04/12/2016 8:16 AM   Nursing:   Tanna Furry, RN 04/12/2016 8:16 AM  CSW:    Roque Lias, [place]   04/12/2016 8:16 AM   Other:  04/12/2016 8:16 AM   Other:   04/12/2016 8:16 AM   Other:  Lars Pinks, Nurse CM 04/12/2016 8:16 AM   Other:   04/12/2016 8:16 AM   Other:  Norberto Sorenson, [place]  04/12/2016 8:16 AM   Other:  04/12/2016 8:16 AM   Other:  04/12/2016 8:16 AM   Other:  04/12/2016 8:16 AM   Other:  04/12/2016 8:16 AM   Other:  04/12/2016 8:16 AM   Other:   04/12/2016 8:16 AM    Scribe for Treatment Team:   Roque Lias B, 04/12/2016 8:16 AM

## 2016-04-12 NOTE — Progress Notes (Signed)
DAR NOTE: Patient presents with anxious affect and depressed mood.  Patient reports hearing voices.  Patient states voices kept him up all night.  Rates depression at 7, hopelessness at 5, and anxiety at 10.  Maintained on routine safety checks.  Medications given as prescribed.  Support and encouragement offered as needed.  Attended group and participated.  States goal for today is "go to group."  Patient observed socializing with peers in the dayroom.  Patient ambulate with walker on the unit.  Gait unsteady.

## 2016-04-12 NOTE — Progress Notes (Signed)
[facility] MD Progress Note  04/12/2016 2:26 PM ELAI FARRER  MRN:  [id number]  Subjective: Patient reports " I still hear voices and I cannot sleep.'   Objective:Halvor B Stormont is a 45 y.o.  AA male  who has a hx of schizoaffective do , cocaine, cannabis , alcohol abuse,presented to [facility] under [facility] by his sister Shown Melrose [phone] for  suicidal ideations and substance abuse, plan to overdose.  Patient seen and chart reviewed.Discussed patient with treatment team. Pt continues to have [place] - which is frustrating , also has sleep issues. Pt reports he has not been able to sleep well .Reports Trazodone is not effective. Pt with polysubstance abuse - provided substance abuse counseling - continue to support.    Principal Problem: Schizoaffective disorder, bipolar type ([place]) Diagnosis:   Patient Active Problem List   Diagnosis Date Noted  . Hyperprolactinemia ([place]) [E22.1] 04/26/2015  . HTN (hypertension) [I10] 04/24/2015  . Hyperlipidemia [E78.5] 04/24/2015  . Cocaine use disorder, moderate, dependence ([place]) [F14.20] 04/24/2015  . Alcohol use disorder, moderate, dependence ([place]) [F10.20] 04/24/2015  . Cannabis use disorder, severe, dependence ([place]) [F12.20] 04/24/2015  . Tobacco use disorder [F17.200] 04/24/2015  . Schizoaffective disorder, bipolar type ([place]) [F25.0] 04/23/2015  . ERECTILE DYSFUNCTION [F52.8] 06/05/2007   Total Time spent with patient: 25 minutes  Past Psychiatric History: See above  Past Medical History:  Past Medical History:  Diagnosis Date  . Hypercholesteremia   . Hypertension   . Schizo affective schizophrenia [facility])     Past Surgical History:  Procedure Laterality Date  . CHOLECYSTECTOMY     Family History:  Family History  Problem Relation Age of Onset  . Hypertension Mother   . Diabetes Mother   . Mental illness Neg Hx    Family Psychiatric  History: See Above Social History:  History  Alcohol Use  . 1.2 oz/week  . 2 Cans of beer per  week     History  Drug Use  . Types: Cocaine, Marijuana    Social History   Social History  . Marital status: Single    Spouse name: N/A  . Number of children: N/A  . Years of education: N/A   Social History Main Topics  . Smoking status: Current Every Day Smoker    Packs/day: 0.50    Types: Cigarettes  . Smokeless tobacco: Never Used  . Alcohol use 1.2 oz/week    2 Cans of beer per week  . Drug use:     Types: Cocaine, Marijuana  . Sexual activity: Not Asked   Other Topics Concern  . None   Social History Narrative  . None   Additional Social History:                         Sleep: Poor  Appetite:  Fair  Current Medications: Current Facility-Administered Medications  Medication Dose Route Frequency Provider Last Rate Last Dose  . albuterol (PROVENTIL HFA;VENTOLIN HFA) 108 (90 Base) MCG/ACT inhaler 1-2 puff  1-2 puff Inhalation Q6H PRN Patrecia Pour, NP      . alum & mag hydroxide-simeth (MAALOX/MYLANTA) 200-200-20 MG/5ML suspension 30 mL  30 mL Oral Q4H PRN Patrecia Pour, NP      . amantadine (SYMMETREL) capsule 100 mg  100 mg Oral BID Patrecia Pour, NP   100 mg at 04/12/16 0846  . carbamazepine (TEGRETOL XR) 12 hr tablet 200 mg  200 mg Oral BID Patrecia Pour, NP  200 mg at 04/12/16 0846  . fluPHENAZine (PROLIXIN) tablet 10 mg  10 mg Oral QPM Ursula Alert, MD      . Derrill Memo ON 04/23/2016] fluPHENAZine decanoate (PROLIXIN) injection 25 mg  25 mg Intramuscular Q14 Days Gibson Lad, MD      . hydrochlorothiazide (HYDRODIURIL) tablet 25 mg  25 mg Oral Daily Patrecia Pour, NP   25 mg at 04/12/16 0846  . magnesium hydroxide (MILK OF MAGNESIA) suspension 30 mL  30 mL Oral Daily PRN Patrecia Pour, NP      . mirtazapine (REMERON) tablet 7.5 mg  7.5 mg Oral QHS Johnryan Sao, MD      . nicotine (NICODERM CQ - dosed in mg/24 hours) patch 21 mg  21 mg Transdermal Daily Ursula Alert, MD   21 mg at 04/12/16 0846  . traZODone (DESYREL) tablet 100 mg  100 mg  Oral QHS PRN Ursula Alert, MD        Lab Results:  Results for orders placed or performed during the hospital encounter of 04/09/16 (from the past 48 hour(s))  Lipid panel     Status: Abnormal   Collection Time: 04/12/16  6:30 AM  Result Value Ref Range   Cholesterol 197 0 - 200 mg/dL   Triglycerides 166 (H) <150 mg/dL   HDL 34 (L) >40 mg/dL   Total CHOL/HDL Ratio 5.8 RATIO   VLDL 33 0 - 40 mg/dL   LDL Cholesterol 130 (H) 0 - 99 mg/dL    Comment:        Total Cholesterol/HDL:CHD Risk Coronary Heart Disease Risk Table                     Men   Women  1/2 Average Risk   3.4   3.3  Average Risk       5.0   4.4  2 X Average Risk   9.6   7.1  3 X Average Risk  23.4   11.0        Use the calculated Patient Ratio above and the CHD Risk Table to determine the patient's CHD Risk.        ATP III CLASSIFICATION (LDL):  <100     mg/dL   Optimal  100-129  mg/dL   Near or Above                    Optimal  130-159  mg/dL   Borderline  160-189  mg/dL   High  >190     mg/dL   Very High Performed at [facility]   TSH     Status: None   Collection Time: 04/12/16  6:30 AM  Result Value Ref Range   TSH 1.187 0.350 - 4.500 uIU/mL    Comment: Performed at [facility]    Blood Alcohol level:  Lab Results  Component Value Date   ETH 9 (H) 04/08/2016   ETH <5 [id number]    Metabolic Disorder Labs: Lab Results  Component Value Date   HGBA1C 5.4 04/25/2015   MPG 108 04/25/2015   Lab Results  Component Value Date   PROLACTIN 19.3 (H) 04/25/2015   Lab Results  Component Value Date   CHOL 197 04/12/2016   TRIG 166 (H) 04/12/2016   HDL 34 (L) 04/12/2016   CHOLHDL 5.8 04/12/2016   VLDL 33 04/12/2016   LDLCALC 130 (H) 04/12/2016   LDLCALC 126 (H) 04/25/2015    Physical Findings: AIMS: Facial  and Oral Movements Muscles of Facial Expression: None, normal Lips and Perioral Area: None, normal Jaw: None, normal Tongue: None, normal,Extremity  Movements Upper (arms, wrists, hands, fingers): None, normal Lower (legs, knees, ankles, toes): None, normal, Trunk Movements Neck, shoulders, hips: None, normal, Overall Severity Severity of abnormal movements (highest score from questions above): None, normal Incapacitation due to abnormal movements: None, normal Patient's awareness of abnormal movements (rate only patient's report): No Awareness, Dental Status Current problems with teeth and/or dentures?: No Does patient usually wear dentures?: No  CIWA:    COWS:     Musculoskeletal: Strength & Muscle Tone: within normal limits Gait & Station: normal Patient leans: N/A  Psychiatric Specialty Exam: Physical Exam  Nursing note and vitals reviewed. Constitutional: He is oriented to person, place, and time. He appears well-developed.  Neck: Normal range of motion.  Musculoskeletal: Normal range of motion.  Neurological: He is alert and oriented to person, place, and time.  Skin: Skin is warm and dry.  Psychiatric: He has a normal mood and affect. His behavior is normal.    Review of Systems  Psychiatric/Behavioral: Positive for depression and hallucinations. Negative for suicidal ideas. The patient is nervous/anxious and has insomnia.   All other systems reviewed and are negative.   Blood pressure 114/77, pulse 85, temperature 97.9 F (36.6 C), temperature source Oral, resp. rate 18, height 5\' 8"  (1.727 m), weight 88.9 kg (196 lb), SpO2 100 %.Body mass index is 29.8 kg/m.  General Appearance: Disheveled  Eye Contact:  Fair  Speech:  Clear and Coherent  Volume:  Decreased  Mood:  Anxious and Depressed  Affect:  Depressed  Thought Process:  Linear and Descriptions of Associations: Circumstantial  Orientation:  Full (Time, Place, and Person)  Thought Content:  Hallucinations: Auditory, Paranoid Ideation and Rumination  Suicidal Thoughts:  denies , but is a potential danger to self or others due to paranoia   Homicidal  Thoughts:  No  Memory:  Immediate;   Fair Recent;   Fair Remote;   Fair  Judgement:  Fair  Insight:  Lacking  Psychomotor Activity:  Restlessness  Concentration:  Concentration: Fair  Recall:  [employer] of Knowledge:  Fair  Language:  Good  Akathisia:  No  Handed:  Right  AIMS (if indicated):     Assets:  Communication Skills Desire for Improvement Resilience Social Support  ADL's:  Intact  Cognition:  WNL  Sleep:  Number of Hours: 6.5     Treatment Plan Summary:Vonte B Shadburn is a 45 y.o.  AA male  who has a hx of schizoaffective do , cocaine, cannabis , alcohol abuse,[facility]ed [facility] by his sister Raun Keil [phone] for  suicidal ideations and substance abuse, plan to overdose.  Patient continues to need medication readjustment due to continued psychosis, sleep issues.  Daily contact with patient to assess and evaluate symptoms and progress in treatment and Medication management  Reviewed past medical records,treatment plan.  Will increase Prolixin to 10 mg po qhs for psychosis/mood sx. Will increase Prolixin decanoate to 25 mg IM q14 days - next dose 04/23/16. Will continue Amantadine 100 mg po bid for side effects of prolixin. Continue Tegretol 200 mg po bid for mood sx. Tegretol level on 04/13/16. Will change Trazodone to 100 mg po qhs prn for sleep. Will add Remeron 7.5 mg po qhs for sleep. Will continue to monitor vitals ,medication compliance and treatment side effects while patient is here.  Will monitor for medical issues  as well as call consult as needed.  Reviewed labs ,will order tegretol level, repeat BMP for low K+ level. CSW will continue working on disposition. Patient to be referred to substance abuse program as needed. Patient to participate in therapeutic milieu .      Laurin Morgenstern, MD 04/12/2016, 2:26 PM

## 2016-04-12 NOTE — Progress Notes (Signed)
Patient has been isolative to his room, observed lying in bed resting. He did come to medication window to receive his medications and was given snacks. He voiced no complaints and writer encouraged him to try and attend groups during when feeling better. Support given and safety maintained on unit with 15 min checks.

## 2016-04-12 NOTE — Progress Notes (Signed)
Adult Psychoeducational Group Note  Date:  04/12/2016 Time:  10:11 PM  Group Topic/Focus:  Wrap-Up Group:   The focus of this group is to help patients review their daily goal of treatment and discuss progress on daily workbooks.   Participation Level:  Active  Participation Quality:  Appropriate  Affect:  Appropriate  Cognitive:  Appropriate  Insight: Appropriate  Engagement in Group:  Engaged  Modes of Intervention:  Socialization and Support  Additional Comments:  Patient attended and participated in group tonight. He reports that he slept all day, however, he did went for his meals and attended his groups.  Salley [facility] 04/12/2016, 10:11 PM

## 2016-04-12 NOTE — BHH Group Notes (Signed)
[place] LCSW Group Therapy  04/12/2016 1:15 pm  Type of Therapy: Process Group Therapy  Participation Level:  Active  Participation Quality:  Appropriate  Affect:  Flat  Cognitive:  Oriented  Insight:  Improving  Engagement in Group:  Limited  Engagement in Therapy:  Limited  Modes of Intervention:  Activity, Clarification, Education, Problem-solving and Support  Summary of Progress/Problems: Today's group addressed the issue of overcoming obstacles.  Patients were asked to identify their biggest obstacle post d/c that stands in the way of their on-going success, and then problem solve as to how to manage this.  Invited.  Chose to not participate.  [name] 04/12/2016   3:46 PM

## 2016-04-12 NOTE — Progress Notes (Signed)
Recreation Therapy Notes  Date: 04/12/16 Time: 1005 Location: 500 Hall Dayroom  Group Topic: Communication  Goal Area(s) Addresses:  Patient will effectively communicate with peers in group.  Patient will verbalize benefit of healthy communication. Patient will verbalize positive effect of healthy communication on post d/c goals.  Patient will identify communication techniques that made activity effective for group.   Intervention: Bag, various items  Activity: What is it?  LRT placed various items in a bag.  Patients took turns taking an item from the bag and describing it to the rest of the group.  The remaining members of the group had to guess what was being described to them.  The person that guessed the item, would come up and pick a different item to describe.   Education: Communication, Discharge Planning  Clinical Observations/Feedback:  Pt did not attend group.      Victorino Sparrow, LRT/CTRS

## 2016-04-13 LAB — BASIC METABOLIC PANEL
ANION GAP: 8 (ref 5–15)
BUN: 14 mg/dL (ref 6–20)
CALCIUM: 8.7 mg/dL — AB (ref 8.9–10.3)
CHLORIDE: 103 mmol/L (ref 101–111)
CO2: 27 mmol/L (ref 22–32)
Creatinine, Ser: 1 mg/dL (ref 0.61–1.24)
GFR calc non Af Amer: >60 mL/min (ref >60)
GLUCOSE: 114 mg/dL — AB (ref 65–99)
POTASSIUM: 3.6 mmol/L (ref 3.5–5.1)
Sodium: 138 mmol/L (ref 135–145)

## 2016-04-13 LAB — PROLACTIN: Prolactin: 24.7 ng/mL — ABNORMAL HIGH (ref 4.0–15.2)

## 2016-04-13 LAB — CARBAMAZEPINE LEVEL, TOTAL: Carbamazepine Lvl: 7.6 ug/mL (ref 4.0–12.0)

## 2016-04-13 LAB — HEMOGLOBIN A1C
Hgb A1c MFr Bld: 5.3 % (ref 4.8–5.6)
MEAN PLASMA GLUCOSE: 105 mg/dL

## 2016-04-13 MED ORDER — MUSCLE RUB 10-15 % EX CREA
TOPICAL_CREAM | CUTANEOUS | Status: DC | PRN
  Filled 2016-04-13: qty 85

## 2016-04-13 MED ORDER — LIDOCAINE 5 % EX PTCH: 1.0000 | MEDICATED_PATCH | CUTANEOUS | Status: DC

## 2016-04-13 NOTE — BHH Group Notes (Signed)
Patient did not attend group.

## 2016-04-13 NOTE — BHH Group Notes (Signed)
[place] LCSW Group Therapy  04/13/2016 3:27 PM   Type of Therapy:  Group Therapy  Participation Level:  Active  Participation Quality:  Attentive  Affect:  Appropriate  Cognitive:  Appropriate  Insight:  Improving  Engagement in Therapy:  Engaged  Modes of Intervention:  Clarification, Education, Exploration and Socialization  Summary of Progress/Problems: Today's group focused on relapse prevention.  We defined the term, and then brainstormed on ways to prevent relapse. Came late, but stayed the rest of the time.  Engaged throughout.  Talked about his time at [facility], where he had stayed for a year until a couple of months ago.  He was tempted to leave many times, but reminded himself that it would lead to a return to his old life, and he did not want that.  We talked about his stubbornness that got him through that.  Ryan Choi 04/13/2016 , 3:27 PM

## 2016-04-13 NOTE — Progress Notes (Signed)
D: Pt was in the day room upon initial approach.  Pt has depressed affect and mood.  He reports his goal was to "stay up half the day."  Pt reports he attended "1 or 2 groups."  Pt denies SI/HI, reports auditory hallucinations that say "go wild."  Pt has been visible in milieu interacting with peers and staff appropriately.  Pt attended evening group.   A: Introduced self to pt.  Met with pt 1:1 and provided support and encouragement.  Medications administered per order.  PRN medication administered for sleep.  Heat pack provided for pain. R: Pt is compliant with medications.  Pt verbally contracts for safety.  Will continue to monitor and assess.

## 2016-04-13 NOTE — Progress Notes (Signed)
Recreation Therapy Notes  Date: 04/13/16 Time: 1000 Location: 500 Hall Dayroom  Group Topic: Communication, Team Building, Problem Solving  Goal Area(s) Addresses:  Patient will effectively work with peer towards shared goal.  Patient will identify skill used to make activity successful.  Patient will identify how skills used during activity can be used to reach post d/c goals.   Intervention: Beach ball  Activity: Keep It Chartered certified accountant.  Patients will sit in a circle and pass the ball to various members of the group around the circle without letting the ball hit the ground.  Education: Education officer, community, Dentist.    Clinical Observations/Feedback: Pt did not attend group.     Victorino Sparrow, LRT/CTRS

## 2016-04-13 NOTE — Progress Notes (Signed)
DAR NOTE: Patient presents with depressed mood and affect. Reports auditory hallucination.  Maintained on routine safety checks.  Medications given as prescribed.  Support and encouragement offered as needed.  Attended group and participated.  Patient remained in his room sleeping.  Patient encouraged to be visible in the milieu.  Ambulates on the unit with walker.

## 2016-04-13 NOTE — Plan of Care (Signed)
Problem: Health Behavior/Discharge Planning: Goal: Compliance with prescribed medication regimen will improve Outcome: Progressing Pt has been compliant with scheduled medications tonight.

## 2016-04-13 NOTE — Progress Notes (Signed)
[facility] MD Progress Note  04/13/2016 4:27 PM [name]  MRN:  [id number]  Subjective: Patient reports " I still hear voices , they are better."    Objective:Ryan Choi is a 45 y.o.  AA male  who has a hx of schizoaffective do , cocaine, cannabis , alcohol abuse,presented to [facility] under [facility]C by his sister Ryan Choi [phone] for  suicidal ideations and substance abuse, plan to overdose.  Patient seen and chart reviewed.Discussed patient with treatment team. Pt continues to have Ryan Choi , however states they are better. Pt also reports having good effect from the current medications and his sleep has improved. Pt continues to pain from his recent injury to his forearm - discussed topical analgesics- he agrees.. Per staff - pt is more visible on the unit - continues to need encouragement and support.     Principal Problem: Schizoaffective disorder, bipolar type ([place]) Diagnosis:   Patient Active Problem List   Diagnosis Date Noted  . Hyperprolactinemia ([place]) [E22.1] 04/26/2015  . HTN (hypertension) [I10] 04/24/2015  . Hyperlipidemia [E78.5] 04/24/2015  . Cocaine use disorder, moderate, dependence ([place]) [F14.20] 04/24/2015  . Alcohol use disorder, moderate, dependence ([place]) [F10.20] 04/24/2015  . Cannabis use disorder, severe, dependence ([place]) [F12.20] 04/24/2015  . Tobacco use disorder [F17.200] 04/24/2015  . Schizoaffective disorder, bipolar type ([place]) [F25.0] 04/23/2015  . ERECTILE DYSFUNCTION [F52.8] 06/05/2007   Total Time spent with patient: 25 minutes  Past Psychiatric History: See above  Past Medical History:  Past Medical History:  Diagnosis Date  . Hypercholesteremia   . Hypertension   . Schizo affective schizophrenia [facility])     Past Surgical History:  Procedure Laterality Date  . CHOLECYSTECTOMY     Family History:  Family History  Problem Relation Age of Onset  . Hypertension Mother   . Diabetes Mother   . Mental illness Neg Hx    Family  Psychiatric  History: See Above Social History:  History  Alcohol Use  . 1.2 oz/week  . 2 Cans of beer per week     History  Drug Use  . Types: Cocaine, Marijuana    Social History   Social History  . Marital status: Single    Spouse name: N/A  . Number of children: N/A  . Years of education: N/A   Social History Main Topics  . Smoking status: Current Every Day Smoker    Packs/day: 0.50    Types: Cigarettes  . Smokeless tobacco: Never Used  . Alcohol use 1.2 oz/week    2 Cans of beer per week  . Drug use:     Types: Cocaine, Marijuana  . Sexual activity: Not Asked   Other Topics Concern  . None   Social History Narrative  . None   Additional Social History:                         Sleep: Fair  Appetite:  Fair  Current Medications: Current Facility-Administered Medications  Medication Dose Route Frequency Provider Last Rate Last Dose  . albuterol (PROVENTIL HFA;VENTOLIN HFA) 108 (90 Base) MCG/ACT inhaler 1-2 puff  1-2 puff Inhalation Q6H PRN Patrecia Pour, NP      . alum & mag hydroxide-simeth (MAALOX/MYLANTA) 200-200-20 MG/5ML suspension 30 mL  30 mL Oral Q4H PRN Patrecia Pour, NP      . amantadine (SYMMETREL) capsule 100 mg  100 mg Oral BID Patrecia Pour, NP   100 mg at 04/13/16  [id number]  . carbamazepine (TEGRETOL XR) 12 hr tablet 200 mg  200 mg Oral BID Patrecia Pour, NP   200 mg at 04/13/16 0919  . fluPHENAZine (PROLIXIN) tablet 10 mg  10 mg Oral QPM Yannis Broce, MD   10 mg at 04/12/16 1719  . [START ON 04/23/2016] fluPHENAZine decanoate (PROLIXIN) injection 25 mg  25 mg Intramuscular Q14 Days Jocelin Schuelke, MD      . hydrochlorothiazide (HYDRODIURIL) tablet 25 mg  25 mg Oral Daily Patrecia Pour, NP   25 mg at 04/13/16 0919  . magnesium hydroxide (MILK OF MAGNESIA) suspension 30 mL  30 mL Oral Daily PRN Patrecia Pour, NP      . mirtazapine (REMERON) tablet 7.5 mg  7.5 mg Oral QHS Ursula Alert, MD   7.5 mg at 04/12/16 2105  . MUSCLE RUB CREA    Topical PRN Ursula Alert, MD      . nicotine (NICODERM CQ - dosed in mg/24 hours) patch 21 mg  21 mg Transdermal Daily Ursula Alert, MD   21 mg at 04/13/16 0921  . traZODone (DESYREL) tablet 100 mg  100 mg Oral QHS PRN Ursula Alert, MD   100 mg at 04/12/16 2105    Lab Results:  Results for orders placed or performed during the hospital encounter of 04/09/16 (from the past 48 hour(s))  Prolactin     Status: Abnormal   Collection Time: 04/12/16  6:30 AM  Result Value Ref Range   Prolactin 24.7 (H) 4.0 - 15.2 ng/mL    Comment: (NOTE) Performed At: [facility] [place], [place] [id number] Lindon Romp MD [id number] Performed at [facility]   Lipid panel     Status: Abnormal   Collection Time: 04/12/16  6:30 AM  Result Value Ref Range   Cholesterol 197 0 - 200 mg/dL   Triglycerides 166 (H) <150 mg/dL   HDL 34 (L) >40 mg/dL   Total CHOL/HDL Ratio 5.8 RATIO   VLDL 33 0 - 40 mg/dL   LDL Cholesterol 130 (H) 0 - 99 mg/dL    Comment:        Total Cholesterol/HDL:CHD Risk Coronary Heart Disease Risk Table                     Men   Women  1/2 Average Risk   3.4   3.3  Average Risk       5.0   4.4  2 X Average Risk   9.6   7.1  3 X Average Risk  23.4   11.0        Use the calculated Patient Ratio above and the CHD Risk Table to determine the patient's CHD Risk.        ATP III CLASSIFICATION (LDL):  <100     mg/dL   Optimal  100-129  mg/dL   Near or Above                    Optimal  130-159  mg/dL   Borderline  160-189  mg/dL   High  >190     mg/dL   Very High Performed at [facility]   Hemoglobin A1c     Status: None   Collection Time: 04/12/16  6:30 AM  Result Value Ref Range   Hgb A1c MFr Bld 5.3 4.8 - 5.6 %    Comment: (NOTE)         Pre-diabetes: 5.7 -  6.4         Diabetes: >6.4         Glycemic control for adults with diabetes: <7.0    Mean Plasma Glucose 105 mg/dL    Comment: (NOTE) Performed At: [facility] [place], [place] [id number] Lindon Romp MD [id number] Performed at [facility]   TSH     Status: None   Collection Time: 04/12/16  6:30 AM  Result Value Ref Range   TSH 1.187 0.350 - 4.500 uIU/mL    Comment: Performed at [facility]  Carbamazepine level, total     Status: None   Collection Time: 04/13/16  6:42 AM  Result Value Ref Range   Carbamazepine Lvl 7.6 4.0 - 12.0 ug/mL    Comment: Performed at [facility]    Blood Alcohol level:  Lab Results  Component Value Date   ETH 9 (H) 04/08/2016   ETH <5 [id number]    Metabolic Disorder Labs: Lab Results  Component Value Date   HGBA1C 5.3 04/12/2016   MPG 105 04/12/2016   MPG 108 04/25/2015   Lab Results  Component Value Date   PROLACTIN 24.7 (H) 04/12/2016   PROLACTIN 19.3 (H) 04/25/2015   Lab Results  Component Value Date   CHOL 197 04/12/2016   TRIG 166 (H) 04/12/2016   HDL 34 (L) 04/12/2016   CHOLHDL 5.8 04/12/2016   VLDL 33 04/12/2016   LDLCALC 130 (H) 04/12/2016   LDLCALC 126 (H) 04/25/2015    Physical Findings: AIMS: Facial and Oral Movements Muscles of Facial Expression: None, normal Lips and Perioral Area: None, normal Jaw: None, normal Tongue: None, normal,Extremity Movements Upper (arms, wrists, hands, fingers): None, normal Lower (legs, knees, ankles, toes): None, normal, Trunk Movements Neck, shoulders, hips: None, normal, Overall Severity Severity of abnormal movements (highest score from questions above): None, normal Incapacitation due to abnormal movements: None, normal Patient's awareness of abnormal movements (rate only patient's report): No Awareness, Dental Status Current problems with teeth and/or dentures?: No Does patient usually wear dentures?: No  CIWA:    COWS:     Musculoskeletal: Strength & Muscle Tone: within normal limits Gait & Station: normal Patient leans: N/A  Psychiatric  Specialty Exam: Physical Exam  Nursing note and vitals reviewed. Constitutional: He is oriented to person, place, and time. He appears well-developed.  Neck: Normal range of motion.  Musculoskeletal: Normal range of motion.  Neurological: He is alert and oriented to person, place, and time.  Skin: Skin is warm and dry.  Psychiatric: He has a normal mood and affect. His behavior is normal.    Review of Systems  Psychiatric/Behavioral: Positive for depression, hallucinations and substance abuse. Negative for suicidal ideas. The patient is nervous/anxious.   All other systems reviewed and are negative.   Blood pressure (!) 128/95, pulse 83, temperature 97.9 F (36.6 C), temperature source Oral, resp. rate 18, height 5\' 8"  (1.727 m), weight 88.9 kg (196 lb), SpO2 100 %.Body mass index is 29.8 kg/m.  General Appearance: Fairly Groomed  Eye Contact:  Fair  Speech:  Clear and Coherent  Volume:  Decreased  Mood:  Anxious and Depressed  Affect:  Depressed  Thought Process:  Linear and Descriptions of Associations: Circumstantial  Orientation:  Full (Time, Place, and Person)  Thought Content:  Hallucinations: Auditory, Paranoid Ideation and Rumination improving  Suicidal Thoughts:  denies , but is a potential danger to self or others due to paranoia   Homicidal  Thoughts:  No  Memory:  Immediate;   Fair Recent;   Fair Remote;   Fair  Judgement:  Fair  Insight:  Lacking  Psychomotor Activity:  Restlessness  Concentration:  Concentration: Fair  Recall:  [employer] of Knowledge:  Fair  Language:  Good  Akathisia:  No  Handed:  Right  AIMS (if indicated):     Assets:  Communication Skills Desire for Improvement Resilience Social Support  ADL's:  Intact  Cognition:  WNL  Sleep:  Number of Hours: 6.5     Treatment Plan Summary:Ryan Choi is a 45 y.o.  AA male  who has a hx of schizoaffective do , cocaine, cannabis , alcohol abuse,[facility]ed [facility] by his sister  Ryan Choi [phone] for  suicidal ideations and substance abuse, plan to overdose.  Patient continues to need medication readjustment due to continued psychosis.  Daily contact with patient to assess and evaluate symptoms and progress in treatment and Medication management  Reviewed past medical records,treatment plan.  Will continue Prolixin 10 mg po qhs for psychosis/mood sx. Will increase Prolixin decanoate to 25 mg IM q14 days - next dose 04/23/16. Will continue Amantadine 100 mg po bid for side effects of prolixin. Continue Tegretol 200 mg po bid for mood sx. Tegretol level on 04/13/16- therapuetic - 7.6 ug/ml Will continue Trazodone  100 mg po qhs prn for sleep. Add Remeron 7.5 mg po qhs for sleep. Add Ben gay topical for pain. Will continue to monitor vitals ,medication compliance and treatment side effects while patient is here.  Will monitor for medical issues as well as call consult as needed.  Reviewed labs ,will repeat BMP for low K+ level. CSW will continue working on disposition. Patient to be referred to substance abuse program as needed. Patient to participate in therapeutic milieu .      Aamari Strawderman, MD 04/13/2016, 4:27 PM

## 2016-04-14 MED ORDER — FLUPHENAZINE HCL 2.5 MG PO TABS
2.5000 mg | ORAL_TABLET | Freq: Two times a day (BID) | ORAL | Status: DC | PRN
  Administered 2016-04-14 – 2016-04-16 (×2): 2.5 mg via ORAL
  Filled 2016-04-14 (×2): qty 1
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
## 2016-04-14 NOTE — Progress Notes (Signed)
DAR NOTE: Patient presents with anxious affect and depressed mood.  Reports  pain, auditory and visual hallucinations.  Rates depression at 7, hopelessness at 7, and anxiety at 40.  Maintained on routine safety checks.  Medications given as prescribed.  Support and encouragement offered as needed.  Attended group and participated.  States goal for today is "restoring health."  Patient observed socializing with peers in the dayroom.  Muscle cream applied to right arm for pain with good effect.  Ambulating on the unit with walker.

## 2016-04-14 NOTE — Progress Notes (Signed)
Recreation Therapy Notes  Date: 04/14/16 Time: 1000 Location: 500 Hall Dayroom  Group Topic: Coping Skills  Goal Area(s) Addresses:  Patient will be able to identify stressors. Patient will be able to identify positive coping skills. Patient will be able to identify how using positive will help them once d/c.  Intervention:  Worksheet, pencils  Activity: Environmental consultant.  Pt is to identify all the things that keep them stuck and from moving forward in life and write it in the center of the spider web.  On the outside of the web, pt will identify positive coping skills they can use to help deal with their stressors and move forward.   Education: Radiographer, therapeutic, Dentist.    Clinical Observations/Feedback: Pt did not attend group.   Victorino Sparrow, LRT/CTRS

## 2016-04-14 NOTE — BHH Group Notes (Signed)
[facility] Mental [facility] Association Group Therapy  04/14/2016 , 2:04 PM    Type of Therapy:  Mental Health Association Presentation  Participation Level:  Active  Participation Quality:  Attentive  Affect:  Blunted  Cognitive:  Oriented  Insight:  Limited  Engagement in Therapy:  Engaged  Modes of Intervention:  Discussion, Education and Socialization  Summary of Progress/Problems:  Ryan Choi from [place] came to present his recovery story and play the guitar.  Stayed the entire time, engaged throughout  [place] B 04/14/2016 , 2:04 PM

## 2016-04-14 NOTE — Progress Notes (Signed)
D: Pt was flat, isolative and withdrawn to room; was in bed all evening. Pt endorsed moderate depression and moderate anxiety. Pt also endorsed moderate lower back pain. Pt used a walker to ambulate; high fall risk. Pt also endorsed auditory hallucination; however denied command hallucinations; states, "they say I'm no good" Pt remained calm ad cooperative. A: Medications offered as prescribed.  Support, encouragement, and safe environment provided.  15-minute safety checks continue. R: Pt was med compliant.  Pt did not attend group. Safety checks continue.

## 2016-04-15 NOTE — Tx Team (Signed)
Interdisciplinary Treatment Plan Update (Adult)  Date:  04/15/2016   Time Reviewed:  12:32 PM   Progress in Treatment: Attending groups: Yes Participating in groups: Yes Taking medication as prescribed:  Yes. Tolerating medication:  Yes. Family/Significant other contact made:  No Patient understands diagnosis:  Yes  As evidenced by seeking help with "these voices, and I can't sleep" Discussing patient identified problems/goals with staff:  Yes, see initial care plan. Medical problems stabilized or resolved:  Yes. Denies suicidal/homicidal ideation: Yes. Issues/concerns per patient self-inventory:  No. Other:  New problem(s) identified:  Discharge Plan or Barriers: see below  Reason for Continuation of Hospitalization: Depression Hallucinations Medication stabilization  Comments:  Pt arrives to the ER under IVC; pt's sister took out IVC stating that pt is not taking his medication as prescribed; pt has been found with his pockets full of medications that were not prescribed to patient; sister reports that pt has been uses crack and marijuana and other unknown "pills"; sister reports that pt has been hearing voices and has been making statements "I feel like hurting myself"; pt denies SI / HI in triage; pt states that he has not been taking his medication; pt has also made statements that he has thoughts of hurting people that have tried to help him  Will increase Prolixin to 10 mg po qhs for psychosis/mood sx. Will increase Prolixin decanoate to 25 mg IM q14 days - next dose 04/23/16. Will continue Amantadine 100 mg po bid for side effects of prolixin. Continue Tegretol 200 mg po bid for mood sx. Tegretol level on 04/13/16. Will change Trazodone to 100 mg po qhs prn for sleep. Will add Remeron 7.5 mg po qhs for sleep.  8/10: Prolixin shot was restarted with plan for up titration with oral medication. Today he endorses ego dystonic occasional CAH to hurt other people, although he denies  any intent/plans. He demonstrates good insight into his symptoms and displayed good coping skills. Will have prn Prolixin for worsening AH.   Will continue Prolixin 10 mg po qhs for psychosis/mood sx.(EKG Qtc 422 msec on 8/8) Will increase Prolixin decanoate to 25 mg IM q14 days - next dose 04/23/16.  Start Prolixin 2.5 mg q12hprn for AH, agitation Will continue Amantadine 100 mg po bid for side effects of prolixin. Continue Tegretol 200 mg po bid for mood sx. Tegretol level on 04/13/16- therapeutic - 7.6 ug/ml Will continue Trazodone  100 mg po qhs prn for sleep. Continue Remeron 7.5 mg po qhs for sleep.  Estimated length of stay: [facility] d/c Sunday  New goal(s):  Review of initial/current patient goals per problem list:   Review of initial/current patient goals per problem list:  1. Goal(s): Patient will participate in aftercare plan   Met: Yes   Target date: 3-5 days post admission date   As evidenced by: Patient will participate within aftercare plan AEB aftercare provider and housing plan at discharge being identified. 04/12/16:  Return home, follow up outpt   2. Goal (s): Patient will exhibit decreased depressive symptoms and suicidal ideations.   Met: No   Target date: 3-5 days post admission date   As evidenced by: Patient will utilize self rating of depression at 3 or below and demonstrate decreased signs of depression or be deemed stable for discharge by MD. 04/12/16: Rates depression a 6 today 04/15/16:  Rates depression a 5 today       4. Goal(s): Patient will demonstrate decreased signs of withdrawal due to substance abuse  Met: Yes   Target date: 3-5 days post admission date   As evidenced by: Patient will produce a CIWA/COWS score of 0, have stable vitals signs, and no symptoms of withdrawal 04/12/16:  No sign nor symptoms of withdrawal today    5. Goal(s): Patient will demonstrate decreased signs of psychosis  * Met: Progressing  * Target date:  3-5 days post admission date  * As evidenced by: Patient will demonstrate decreased frequency of AVH or return to baseline function 04/12/16:  Pt c/o AH today, to the extent that he is unable to sleep 04/15/16:  Now able to sleep; states voices have diminished to a murmer          Attendees: Patient:  04/15/2016 12:32 PM   Family:   04/15/2016 12:32 PM   Physician:  Ursula Alert, MD 04/15/2016 12:32 PM   Nursing:   Tanna Furry, RN 04/15/2016 12:32 PM   CSW:    Roque Lias, LCSW   04/15/2016 12:32 PM   Other:  04/15/2016 12:32 PM   Other:   04/15/2016 12:32 PM   Other:  Lars Pinks, Nurse CM 04/15/2016 12:32 PM   Other:   04/15/2016 12:32 PM   Other:  Norberto Sorenson, [place]  04/15/2016 12:32 PM   Other:  04/15/2016 12:32 PM   Other:  04/15/2016 12:32 PM   Other:  04/15/2016 12:32 PM   Other:  04/15/2016 12:32 PM   Other:  04/15/2016 12:32 PM   Other:   04/15/2016 12:32 PM    Scribe for Treatment Team:   [name], 04/15/2016 12:32 PM

## 2016-04-15 NOTE — Progress Notes (Signed)
DAR NOTE: Patient mood and affect remained depressed.  Reports sleeping well and hearing less voices.  Rates depression at 2, hopelessness at 0, and anxiety at 2.  Described energy level as normal and concentration as good.  Maintained on routine safety checks.  Medications given as prescribed.  Support and encouragement offered as needed.  Attended group and participated.  States goal for today is "going home."  Patient remained in his room sleeping.

## 2016-04-15 NOTE — Progress Notes (Signed)
D: Pt was flat, isolative and withdrawn to room; was in bed all evening. Pt endorsed moderate depression and moderate anxiety. Pt endorses moderate lower back pain. Pt uses a walker to ambulate; high fall risk. Pt also endorsed auditory hallucination; however denied command hallucinations; states, "They say I'm no good; what am I doing" Pt remained calm ad cooperative. A: Medications offered as prescribed.  Support, encouragement, and safe environment provided.  15-minute safety checks continue. R: Pt was med compliant.  Pt did not attend group. Safety checks continue.

## 2016-04-15 NOTE — Progress Notes (Addendum)
Patient ID: ASHA SEARS, male   DOB: [birth year], 45 y.o.   MRN: [id number]  Pt currently presents with a pleasant affect and anxious behavior. Pt reports to writer that their goal is to "go to groups." Pt states "I had a good, I don't want to go to [place] though." Pt reports good sleep with medication regimen that he was on last night.   Pt provided with scheduled and as needed medications per providers orders. Pt's labs and vitals were monitored throughout the night. Pt supported emotionally and encouraged to express concerns and questions. Pt educated on medications. Pt encouraged to attend [place] group. Pt high fall risk, all signage checked.    Pt's safety ensured with 15 minute and environmental checks. Pt currently denies SI/HI and A/V hallucinations. Pt verbally agrees to seek staff if SI/HI or A/VH occurs and to consult with staff before acting on any harmful thoughts. Pt attends group tonight. Will continue POC.

## 2016-04-15 NOTE — BHH Group Notes (Signed)
[facility] Group Notes:  (Counselor/Nursing/MHT/Case Management/Adjunct)  04/15/2016 1:15PM  Type of Therapy:  Group Therapy  Participation Level:  Active  Participation Quality:  Appropriate  Affect:  Flat  Cognitive:  Oriented  Insight:  Improving  Engagement in Group:  Limited  Engagement in Therapy:  Limited  Modes of Intervention:  Discussion, Exploration and Socialization  Summary of Progress/Problems: The topic for group was balance in life.  Pt participated in the discussion about when their life was in balance and out of balance and how this feels.  Pt discussed ways to get back in balance and short term goals they can work on to get where they want to be. Stayed the entire time, engaged throughout, genuine.  "I've been working on getting balanced since being here.  My sister IVC'd me, and I was mad, but I needed to be here, and I am accepting of that now.  I was drinking and smoking weed and not taking my meds, and saying hurtful things to others. To get right I have been sleeping and working on surrendering, because me doing it my own way does not work."  Identified his niece as something that helps him find balance.  "I love her to death, and she is so kind and innocent.  She brings me joy."   [name] 04/15/2016 3:53 PM

## 2016-04-15 NOTE — BHH Suicide Risk Assessment (Signed)
[facility] INPATIENT:  Family/Significant Other Suicide Prevention Education  Suicide Prevention Education:  Education Completed; No one has been identified by the patient as the family member/significant other with whom the patient will be residing, and identified as the person(s) who will aid the patient in the event of a mental health crisis (suicidal ideations/suicide attempt).  With written consent from the patient, the family member/significant other has been provided the following suicide prevention education, prior to the and/or following the discharge of the patient.  The suicide prevention education provided includes the following:  Suicide risk factors  Suicide prevention and interventions  National Suicide Hotline telephone number  [facility] assessment telephone number  [facility] Emergency Assistance [place] and/or Residential Mobile Crisis Unit telephone number  Request made of family/significant other to:  Remove weapons (e.g., guns, rifles, knives), all items previously/currently identified as safety concern.    Remove drugs/medications (over-the-counter, prescriptions, illicit drugs), all items previously/currently identified as a safety concern.  The family member/significant other verbalizes understanding of the suicide prevention education information provided.  The family member/significant other agrees to remove the items of safety concern listed above. The patient did not endorse SI at the time of admission, nor did the patient c/o SI during the stay here.  SPE not required. However, I did talk to sister Joseph Art who IVS'd patient.  She feels strongly that he needs to go on to rehab from here for another 30 days.  Told her I would follow up with patient about this.  [place] 04/15/2016, 4:24 PM

## 2016-04-15 NOTE — Progress Notes (Signed)
Patient ID: Ryan Choi, male   DOB: 17-Nov-1970, 45 y.o.   MRN: [id number] Patient ID: Ryan Choi, male   DOB: Jun 30, 1971, 45 y.o.   MRN: [id number] [facility] MD Progress Note  04/15/2016 5:57 PM Ryan Choi  MRN:  [id number]  Subjective:  Patient seen, chart reviewed and case discussed with nursing staff.  - pt took prn prolixin last night.   He states that he feels much better today. He still hears some mumbling voices, but reports significant improvement and denies CAH. He could attend all groups today. He denies SI/HI.   Principal Problem: Schizoaffective disorder, bipolar type ([place]) Diagnosis:   Patient Active Problem List   Diagnosis Date Noted  . Hyperprolactinemia ([place]) [E22.1] 04/26/2015  . HTN (hypertension) [I10] 04/24/2015  . Hyperlipidemia [E78.5] 04/24/2015  . Cocaine use disorder, moderate, dependence ([place]) [F14.20] 04/24/2015  . Alcohol use disorder, moderate, dependence ([place]) [F10.20] 04/24/2015  . Cannabis use disorder, severe, dependence ([place]) [F12.20] 04/24/2015  . Tobacco use disorder [F17.200] 04/24/2015  . Schizoaffective disorder, bipolar type ([place]) [F25.0] 04/23/2015  . ERECTILE DYSFUNCTION [F52.8] 06/05/2007   Total Time spent with patient: 20 minutes  Past Psychiatric History: See above  Past Medical History:  Past Medical History:  Diagnosis Date  . Hypercholesteremia   . Hypertension   . Schizo affective schizophrenia [facility])     Past Surgical History:  Procedure Laterality Date  . CHOLECYSTECTOMY     Family History:  Family History  Problem Relation Age of Onset  . Hypertension Mother   . Diabetes Mother   . Mental illness Neg Hx    Family Psychiatric  History: See Above Social History:  History  Alcohol Use  . 1.2 oz/week  . 2 Cans of beer per week     History  Drug Use  . Types: Cocaine, Marijuana    Social History   Social History  . Marital status: Single    Spouse name: N/A  . Number of children: N/A  . Years of  education: N/A   Social History Main Topics  . Smoking status: Current Every Day Smoker    Packs/day: 0.50    Types: Cigarettes  . Smokeless tobacco: Never Used  . Alcohol use 1.2 oz/week    2 Cans of beer per week  . Drug use:     Types: Cocaine, Marijuana  . Sexual activity: Not Asked   Other Topics Concern  . None   Social History Narrative  . None   Additional Social History:                         Sleep: Fair  Appetite:  Fair  Current Medications: Current Facility-Administered Medications  Medication Dose Route Frequency Provider Last Rate Last Dose  . albuterol (PROVENTIL HFA;VENTOLIN HFA) 108 (90 Base) MCG/ACT inhaler 1-2 puff  1-2 puff Inhalation Q6H PRN Patrecia Pour, NP      . alum & mag hydroxide-simeth (MAALOX/MYLANTA) 200-200-20 MG/5ML suspension 30 mL  30 mL Oral Q4H PRN Patrecia Pour, NP      . amantadine (SYMMETREL) capsule 100 mg  100 mg Oral BID Patrecia Pour, NP   100 mg at 04/15/16 1714  . carbamazepine (TEGRETOL XR) 12 hr tablet 200 mg  200 mg Oral BID Patrecia Pour, NP   200 mg at 04/15/16 1714  . fluPHENAZine (PROLIXIN) tablet 10 mg  10 mg Oral QPM Saramma Eappen, MD   10 mg at  04/15/16 1714  . fluPHENAZine (PROLIXIN) tablet 2.5 mg  2.5 mg Oral BID PRN Norman Clay, MD   2.5 mg at 04/14/16 2232  . [START ON 04/23/2016] fluPHENAZine decanoate (PROLIXIN) injection 25 mg  25 mg Intramuscular Q14 Days Saramma Eappen, MD      . hydrochlorothiazide (HYDRODIURIL) tablet 25 mg  25 mg Oral Daily Patrecia Pour, NP   25 mg at 04/15/16 6759  . magnesium hydroxide (MILK OF MAGNESIA) suspension 30 mL  30 mL Oral Daily PRN Patrecia Pour, NP      . mirtazapine (REMERON) tablet 7.5 mg  7.5 mg Oral QHS Ursula Alert, MD   7.5 mg at 04/14/16 2129  . MUSCLE RUB CREA   Topical PRN Ursula Alert, MD      . nicotine (NICODERM CQ - dosed in mg/24 hours) patch 21 mg  21 mg Transdermal Daily Saramma Eappen, MD   21 mg at 04/14/16 0800  . traZODone (DESYREL)  tablet 100 mg  100 mg Oral QHS PRN Ursula Alert, MD   100 mg at 04/14/16 2127    Lab Results:  Results for orders placed or performed during the hospital encounter of 04/09/16 (from the past 48 hour(s))  Basic metabolic panel     Status: Abnormal   Collection Time: 04/13/16  6:21 PM  Result Value Ref Range   Sodium 138 135 - 145 mmol/L   Potassium 3.6 3.5 - 5.1 mmol/L   Chloride 103 101 - 111 mmol/L   CO2 27 22 - 32 mmol/L   Glucose, Bld 114 (H) 65 - 99 mg/dL   BUN 14 6 - 20 mg/dL   Creatinine, Ser 1.00 0.61 - 1.24 mg/dL   Calcium 8.7 (L) 8.9 - 10.3 mg/dL   GFR calc non Af Amer >60 >60 mL/min   GFR calc Af Amer >60 >60 mL/min    Comment: (NOTE) The eGFR has been calculated using the CKD EPI equation. This calculation has not been validated in all clinical situations. eGFR's persistently <60 mL/min signify possible Chronic Kidney Disease.    Anion gap 8 5 - 15    Comment: Performed at [facility]    Blood Alcohol level:  Lab Results  Component Value Date   ETH 9 (H) 04/08/2016   ETH <5 16/38/4665    Metabolic Disorder Labs: Lab Results  Component Value Date   HGBA1C 5.3 04/12/2016   MPG 105 04/12/2016   MPG 108 04/25/2015   Lab Results  Component Value Date   PROLACTIN 24.7 (H) 04/12/2016   PROLACTIN 19.3 (H) 04/25/2015   Lab Results  Component Value Date   CHOL 197 04/12/2016   TRIG 166 (H) 04/12/2016   HDL 34 (L) 04/12/2016   CHOLHDL 5.8 04/12/2016   VLDL 33 04/12/2016   LDLCALC 130 (H) 04/12/2016   LDLCALC 126 (H) 04/25/2015    Physical Findings: AIMS: Facial and Oral Movements Muscles of Facial Expression: None, normal Lips and Perioral Area: None, normal Jaw: None, normal Tongue: None, normal,Extremity Movements Upper (arms, wrists, hands, fingers): None, normal Lower (legs, knees, ankles, toes): None, normal, Trunk Movements Neck, shoulders, hips: None, normal, Overall Severity Severity of abnormal movements (highest score  from questions above): None, normal Incapacitation due to abnormal movements: None, normal Patient's awareness of abnormal movements (rate only patient's report): No Awareness, Dental Status Current problems with teeth and/or dentures?: No Does patient usually wear dentures?: No  CIWA:    COWS:     Musculoskeletal: Strength &  Muscle Tone: within normal limits Gait & Station: normal Patient leans: N/A  Psychiatric Specialty Exam: Physical Exam  Nursing note and vitals reviewed. Constitutional: He is oriented to person, place, and time. He appears well-developed and well-nourished.  Neck: Normal range of motion.  Musculoskeletal: Normal range of motion.  Neurological: He is alert and oriented to person, place, and time.  No tremors. No ridigity  Skin: Skin is warm and dry. Rash noted.    Review of Systems  Skin: Positive for rash.  Psychiatric/Behavioral: Positive for substance abuse. Negative for depression, hallucinations and suicidal ideas. The patient is not nervous/anxious.   All other systems reviewed and are negative.   Blood pressure 135/82, pulse 88, temperature 98.6 F (37 C), temperature source Oral, resp. rate 16, height '5\' 8"'  (1.727 m), weight 196 lb (88.9 kg), SpO2 100 %.Body mass index is 29.8 kg/m.  General Appearance: Fairly Groomed  Eye Contact:  Good  Speech:  Clear and Coherent  Volume:  Normal  Mood:  better  Affect:  less restricted, smiles at times  Thought Process:  Linear,   Orientation:  Full (Time, Place, and Person)  Thought Content:  denies Perceptions: denies AH/VH  Suicidal Thoughts:  denies , but is a potential danger to self or others due to paranoia   Homicidal Thoughts:  No   Memory:  Immediate;   Fair Recent;   Fair Remote;   Fair  Judgement:  Fair  Insight:  Fair  Psychomotor Activity:  Normal No signs of EPS  Concentration:  Concentration: Fair  Recall:  [employer] of Knowledge:  Fair  Language:  Good  Akathisia:  No  Handed:   Right  AIMS (if indicated):     Assets:  Communication Skills Desire for Improvement Resilience Social Support  ADL's:  Intact  Cognition:  WNL  Sleep:  Number of Hours: 6.25   Assessment Ryan Choi is a 45 y.o.  AA male  who has a hx of schizoaffective do , cocaine, cannabis , alcohol abuse,presented to [facility] under IVC by his sister Rivaldo Hineman [phone] for  suicidal ideations and substance abuse, plan to overdose in the setting of non adherence to medication.   There is significant improvement with his thought process and AH after uptitration of oral Prolixin. Next decanoate shot is due on 8/18;will continue current regimen at this time given significant improvement in his symptoms.   Plans:  Reviewed past medical records,treatment plan.  Will continue Prolixin 10 mg po qhs for psychosis/mood sx.(EKG Qtc 422 msec on 8/8) Will plan for Prolixin decanoate to 25 mg IM q14 days - next dose 04/23/16.  Continue Prolixin 2.5 mg q12hprn for AH, agitation Will continue Amantadine 100 mg po bid for side effects of prolixin. Continue Tegretol 200 mg po bid for mood sx. Tegretol level on 04/13/16- therapeutic - 7.6 ug/ml Will continue Trazodone  100 mg po qhs prn for sleep. Continue Remeron 7.5 mg po qhs for sleep. Continue Ben gay topical for pain. Will continue to monitor vitals ,medication compliance and treatment side effects while patient is here.  Will monitor for medical issues as well as call consult as needed.  CSW will continue working on disposition. Patient to be referred to substance abuse program as needed. Patient to participate in therapeutic milieu .   Daily contact with patient to assess and evaluate symptoms and progress in treatment and Medication management      Norman Clay, MD 04/15/2016, 5:57 PM

## 2016-04-15 NOTE — Progress Notes (Signed)
Recreation Therapy Notes  Date: 04/15/16 Time: 1000 Location: 500 Hall Dayroom  Group Topic: Leisure Education  Goal Area(s) Addresses:  Patient will identify positive leisure activities.  Patient will identify one positive benefit of participation in leisure activities.   Intervention: Magazines, scissors, glue sticks, construction paper  Activity: Got Rec?:  Patients were to look through magazines and cut out any pictures or words that described any positive leisure activities that they participate in or would like to participate in.  Patients were to take the pictures and words they found and glue them to the construction paper, creating a leisure collage.    Education:  Leisure Education, Dentist  Education Outcome: Needs additional education  Clinical Observations/Feedback: Pt did not attend group.    Victorino Sparrow, LRT/CTRS

## 2016-04-16 MED ORDER — MIRTAZAPINE 15 MG PO TABS
15.0000 mg | ORAL_TABLET | Freq: Every day | ORAL | Status: DC
  Administered 2016-04-16 – 2016-04-17 (×2): 15 mg via ORAL
  Filled 2016-04-16 (×4): qty 1

## 2016-04-16 NOTE — Progress Notes (Signed)
Recreation Therapy Notes  Date: 04/16/16 Time: 1015 Location: 500 Hall Day Room  Group Topic: Stress Management  Goal Area(s) Addresses:  Patient will verbalize importance of using healthy stress management.  Patient will identify positive emotions associated with healthy stress management.   Intervention: Progressive Muscle Relaxation, Guided Imagery Script  Activity :  Progressive Muscle Relaxation, Peaceful Waves Scripts.  LRT introduced the techniques of PMR and Guided Imagery to the patients.  Patients were asked to follow along as LRT read scripts so they could participate in activity.  Education:  Stress Management, Discharge Planning.   Education Outcome: Needs additional education  Clinical Observations/Feedback: Pt did not attend group.   Victorino Sparrow, LRT/CTRS

## 2016-04-16 NOTE — Progress Notes (Signed)
  [facility] Adult Case Management Discharge Plan :  Will you be returning to the same living situation after discharge:  No. At discharge, do you have transportation home?: Yes,  sister Do you have the ability to pay for your medications: Yes,  MCD  Release of information consent forms completed and in the chart;  Patient's signature needed at discharge.  Patient to Follow up at: Follow-up Information    [facility] .   Specialty:  Behavioral Health Why:  Go to the walk-in clinc M-F between 8 and 10 for your hospital follow up appointment if things do not work out with [facility] information: [place] [phone]        [facility] ACT team .   Why:  You will need to call and ask for [facility] on Monday.  I FAXed your info. to them, but he was not in on Thursday ro Friday. Contact information: [place] .   Why:  I faxed your info to them on Friday.  I had to leave a message.  You will need to call Hoyle Sauer on Monday to set up a screening appointment.   Contact information: [place] [phone]          Next level of care provider has access to [place] and Suicide Prevention discussed: Yes,  yes  Have you used any form of tobacco in the last 30 days? (Cigarettes, Smokeless Tobacco, Cigars, and/or Pipes): Yes  Has patient been referred to the Quitline?: Patient refused referral  Patient has been referred for addiction treatment: Yes  [name] 04/16/2016, 3:16 PM

## 2016-04-16 NOTE — BHH Group Notes (Signed)
[facility] LCSW Group Therapy  04/16/2016  1:05 PM  Type of Therapy:  Group therapy  Participation Level:  Active  Participation Quality:  Attentive  Affect:  Flat  Cognitive:  Oriented  Insight:  Limited  Engagement in Therapy:  Limited  Modes of Intervention:  Discussion, Socialization  Summary of Progress/Problems:  Chaplain was here to lead a group on themes of hope and courage.  Walking in faith is what gives me courage.  I have strayed multiple times, but I keep coming back to God because I feel better when I do that.  It keeps me focused."  Talked about gospel songs and how those move him, and help bring him strength in times of trouble.  "It reminds me of all I have been through, and that I have not been alone." Good feedback to others-genuine caring. [name] 04/16/2016 12:48 PM

## 2016-04-16 NOTE — Progress Notes (Signed)
Nursing Progress Note: 7-7p  D- Mood is depressed and anxious, Affect is blunted and appropriate. Pt is able to contract for safety. Continues to have difficulty staying asleep. Reports getting up for second sleeping pill Goal for today is prepare for discharge, " I talked with my sister and I'm going to stay with my cousin for a week even the he uses. It's up to me to stay clean and take my medications otherwise I can't [facility]"  A - Observed pt interacting in group and in the milieu.Support and encouragement offered, safety maintained with q 15 minutes.Gait steady with walker  R-Contracts for safety and continues to follow treatment plan, working on learning new coping skills.Educated on Remeron.

## 2016-04-16 NOTE — Progress Notes (Signed)
Adult Psychoeducational Group Note  Date:  04/16/2016 Time:  10:00 PM  Group Topic/Focus:  Wrap-Up Group:   The focus of this group is to help patients review their daily goal of treatment and discuss progress on daily workbooks.   Participation Level:  Active  Participation Quality:  Appropriate  Affect:  Appropriate  Cognitive:  Appropriate  Insight: Appropriate  Engagement in Group:  Engaged  Modes of Intervention:  Socialization and Support  Additional Comments:  Ryan Choi engaged in wrap up group today. He reports having some difficulty with sleeping at night, but was up most of the day. He is cooperative and attends all groups and is compliant with medications at this time.   [name] [name] 04/16/2016, 10:00 PM

## 2016-04-16 NOTE — Tx Team (Signed)
Interdisciplinary Treatment Plan Update (Adult)  Date:  04/16/2016   Time Reviewed:  3:17 PM   Progress in Treatment: Attending groups: Yes Participating in groups: Yes Taking medication as prescribed:  Yes. Tolerating medication:  Yes. Family/Significant other contact made: Yes Patient understands diagnosis:  Yes  As evidenced by seeking help with "these voices, and I can't sleep" Discussing patient identified problems/goals with staff:  Yes, see initial care plan. Medical problems stabilized or resolved:  Yes. Denies suicidal/homicidal ideation: Yes. Issues/concerns per patient self-inventory:  No. Other:  New problem(s) identified:  Discharge Plan or Barriers: see below  Reason for Continuation of Hospitalization: Depression Hallucinations Medication stabilization  Comments:  Pt arrives to the ER under IVC; pt's sister took out IVC stating that pt is not taking his medication as prescribed; pt has been found with his pockets full of medications that were not prescribed to patient; sister reports that pt has been uses crack and marijuana and other unknown "pills"; sister reports that pt has been hearing voices and has been making statements "I feel like hurting myself"; pt denies SI / HI in triage; pt states that he has not been taking his medication; pt has also made statements that he has thoughts of hurting people that have tried to help him  Will increase Prolixin to 10 mg po qhs for psychosis/mood sx. Will increase Prolixin decanoate to 25 mg IM q14 days - next dose 04/23/16. Will continue Amantadine 100 mg po bid for side effects of prolixin. Continue Tegretol 200 mg po bid for mood sx. Tegretol level on 04/13/16. Will change Trazodone to 100 mg po qhs prn for sleep. Will add Remeron 7.5 mg po qhs for sleep.  8/10: Prolixin shot was restarted with plan for up titration with oral medication. Today he endorses ego dystonic occasional CAH to hurt other people, although he denies  any intent/plans. He demonstrates good insight into his symptoms and displayed good coping skills. Will have prn Prolixin for worsening AH.   Will continue Prolixin 10 mg po qhs for psychosis/mood sx.(EKG Qtc 422 msec on 8/8) Will increase Prolixin decanoate to 25 mg IM q14 days - next dose 04/23/16.  Start Prolixin 2.5 mg q12hprn for AH, agitation Will continue Amantadine 100 mg po bid for side effects of prolixin. Continue Tegretol 200 mg po bid for mood sx. Tegretol level on 04/13/16- therapeutic - 7.6 ug/ml Will continue Trazodone  100 mg po qhs prn for sleep. Continue Remeron 7.5 mg po qhs for sleep.  Estimated length of stay: Lik[place] d/c Sunday  New goal(s):  Review of initial/current patient goals per problem list:   Review of initial/current patient goals per problem list:  1. Goal(s): Patient will participate in aftercare plan   Met: Yes   Target date: 3-5 days post admission date   As evidenced by: Patient will participate within aftercare plan AEB aftercare provider and housing plan at discharge being identified. 04/12/16:  Return home, follow up outpt   2. Goal (s): Patient will exhibit decreased depressive symptoms and suicidal ideations.   Met: No   Target date: 3-5 days post admission date   As evidenced by: Patient will utilize self rating of depression at 3 or below and demonstrate decreased signs of depression or be deemed stable for discharge by MD. 04/12/16: Rates depression a 6 today 04/15/16:  Rates depression a 5 today 04/16/16:  Rates depression a 3 today       4. Goal(s): Patient will demonstrate decreased signs of  withdrawal due to substance abuse   Met: Yes   Target date: 3-5 days post admission date   As evidenced by: Patient will produce a CIWA/COWS score of 0, have stable vitals signs, and no symptoms of withdrawal 04/12/16:  No sign nor symptoms of withdrawal today    5. Goal(s): Patient will demonstrate decreased signs of  psychosis  * Met: Yes  * Target date: 3-5 days post admission date  * As evidenced by: Patient will demonstrate decreased frequency of AVH or return to baseline function 04/12/16:  Pt c/o AH today, to the extent that he is unable to sleep 04/15/16:  Now able to sleep; states voices have diminished to a murmer 04/16/16:  Denies voices today since the addition of PRN prolixin         Attendees: Patient:  04/16/2016 3:17 PM   Family:   04/16/2016 3:17 PM   Physician:  Ursula Alert, MD 04/16/2016 3:17 PM   Nursing:   Tanna Furry, RN 04/16/2016 3:17 PM   CSW:    Roque Lias, [place]   04/16/2016 3:17 PM   Other:  04/16/2016 3:17 PM   Other:   04/16/2016 3:17 PM   Other:  Lars Pinks, Nurse CM 04/16/2016 3:17 PM   Other:   04/16/2016 3:17 PM   Other:  Norberto Sorenson, [place]  04/16/2016 3:17 PM   Other:  04/16/2016 3:17 PM   Other:  04/16/2016 3:17 PM   Other:  04/16/2016 3:17 PM   Other:  04/16/2016 3:17 PM   Other:  04/16/2016 3:17 PM   Other:   04/16/2016 3:17 PM    Scribe for Treatment Team:   [name], 04/16/2016 3:17 PM

## 2016-04-16 NOTE — Progress Notes (Addendum)
Patient ID: Ryan Choi, male   DOB: 05/06/1971, 45 y.o.   MRN: [id number] [facility] MD Progress Note  04/16/2016 10:19 AM Ryan Choi  MRN:  [id number]  Subjective:  Patient seen, chart reviewed and case discussed with nursing staff.  - pt took prn prolixin last night.   He endorses insomnia last night (slept for 4 hours). He took fluphenazine last night for insomnia but denies any AH/VH. His mood is good. He is motivated for sobriety and agrees with going to rehab program. He is planning to stay at his cousin's house before going to rehab program. He denies SI/HI.   Principal Problem: Schizoaffective disorder, bipolar type ([place]) Diagnosis:   Patient Active Problem List   Diagnosis Date Noted  . Hyperprolactinemia ([place]) [E22.1] 04/26/2015  . HTN (hypertension) [I10] 04/24/2015  . Hyperlipidemia [E78.5] 04/24/2015  . Cocaine use disorder, moderate, dependence ([place]) [F14.20] 04/24/2015  . Alcohol use disorder, moderate, dependence ([place]) [F10.20] 04/24/2015  . Cannabis use disorder, severe, dependence ([place]) [F12.20] 04/24/2015  . Tobacco use disorder [F17.200] 04/24/2015  . Schizoaffective disorder, bipolar type ([place]) [F25.0] 04/23/2015  . ERECTILE DYSFUNCTION [F52.8] 06/05/2007   Total Time spent with patient: 20 minutes  Past Psychiatric History: See above  Past Medical History:  Past Medical History:  Diagnosis Date  . Hypercholesteremia   . Hypertension   . Schizo affective schizophrenia [facility])     Past Surgical History:  Procedure Laterality Date  . CHOLECYSTECTOMY     Family History:  Family History  Problem Relation Age of Onset  . Hypertension Mother   . Diabetes Mother   . Mental illness Neg Hx    Family Psychiatric  History: See Above Social History:  History  Alcohol Use  . 1.2 oz/week  . 2 Cans of beer per week     History  Drug Use  . Types: Cocaine, Marijuana    Social History   Social History  . Marital status: Single    Spouse name: N/A  .  Number of children: N/A  . Years of education: N/A   Social History Main Topics  . Smoking status: Current Every Day Smoker    Packs/day: 0.50    Types: Cigarettes  . Smokeless tobacco: Never Used  . Alcohol use 1.2 oz/week    2 Cans of beer per week  . Drug use:     Types: Cocaine, Marijuana  . Sexual activity: Not Asked   Other Topics Concern  . None   Social History Narrative  . None   Additional Social History:                         Sleep: Fair  Appetite:  Fair  Current Medications: Current Facility-Administered Medications  Medication Dose Route Frequency Provider Last Rate Last Dose  . albuterol (PROVENTIL HFA;VENTOLIN HFA) 108 (90 Base) MCG/ACT inhaler 1-2 puff  1-2 puff Inhalation Q6H PRN Patrecia Pour, NP      . alum & mag hydroxide-simeth (MAALOX/MYLANTA) 200-200-20 MG/5ML suspension 30 mL  30 mL Oral Q4H PRN Patrecia Pour, NP      . amantadine (SYMMETREL) capsule 100 mg  100 mg Oral BID Patrecia Pour, NP   100 mg at 04/16/16 0935  . carbamazepine (TEGRETOL XR) 12 hr tablet 200 mg  200 mg Oral BID Patrecia Pour, NP   200 mg at 04/16/16 0935  . fluPHENAZine (PROLIXIN) tablet 10 mg  10 mg Oral QPM Saramma  Eappen, MD   10 mg at 04/15/16 1714  . fluPHENAZine (PROLIXIN) tablet 2.5 mg  2.5 mg Oral BID PRN Norman Clay, MD   2.5 mg at 04/16/16 0247  . [START ON 04/23/2016] fluPHENAZine decanoate (PROLIXIN) injection 25 mg  25 mg Intramuscular Q14 Days Saramma Eappen, MD      . hydrochlorothiazide (HYDRODIURIL) tablet 25 mg  25 mg Oral Daily Patrecia Pour, NP   25 mg at 04/16/16 0935  . magnesium hydroxide (MILK OF MAGNESIA) suspension 30 mL  30 mL Oral Daily PRN Patrecia Pour, NP      . mirtazapine (REMERON) tablet 7.5 mg  7.5 mg Oral QHS Ursula Alert, MD   7.5 mg at 04/15/16 2137  . MUSCLE RUB CREA   Topical PRN Ursula Alert, MD      . nicotine (NICODERM CQ - dosed in mg/24 hours) patch 21 mg  21 mg Transdermal Daily Ursula Alert, MD   21 mg at  04/16/16 0937  . traZODone (DESYREL) tablet 100 mg  100 mg Oral QHS PRN Ursula Alert, MD   100 mg at 04/15/16 2137    Lab Results:  No results found for this or any previous visit (from the past 48 hour(s)).  Blood Alcohol level:  Lab Results  Component Value Date   ETH 9 (H) 04/08/2016   ETH <5 [id number]    Metabolic Disorder Labs: Lab Results  Component Value Date   HGBA1C 5.3 04/12/2016   MPG 105 04/12/2016   MPG 108 04/25/2015   Lab Results  Component Value Date   PROLACTIN 24.7 (H) 04/12/2016   PROLACTIN 19.3 (H) 04/25/2015   Lab Results  Component Value Date   CHOL 197 04/12/2016   TRIG 166 (H) 04/12/2016   HDL 34 (L) 04/12/2016   CHOLHDL 5.8 04/12/2016   VLDL 33 04/12/2016   LDLCALC 130 (H) 04/12/2016   LDLCALC 126 (H) 04/25/2015    Physical Findings: AIMS: Facial and Oral Movements Muscles of Facial Expression: None, normal Lips and Perioral Area: None, normal Jaw: None, normal Tongue: None, normal,Extremity Movements Upper (arms, wrists, hands, fingers): None, normal Lower (legs, knees, ankles, toes): None, normal, Trunk Movements Neck, shoulders, hips: None, normal, Overall Severity Severity of abnormal movements (highest score from questions above): None, normal Incapacitation due to abnormal movements: None, normal Patient's awareness of abnormal movements (rate only patient's report): No Awareness, Dental Status Current problems with teeth and/or dentures?: No Does patient usually wear dentures?: No  CIWA:    COWS:     Musculoskeletal: Strength & Muscle Tone: within normal limits Gait & Station: normal Patient leans: N/A  Psychiatric Specialty Exam: Physical Exam  Nursing note and vitals reviewed. Constitutional: He is oriented to person, place, and time. He appears well-developed and well-nourished.  Neck: Normal range of motion.  Musculoskeletal: Normal range of motion.  Neurological: He is alert and oriented to person, place, and  time.  No tremors. No ridigity  Skin: Skin is warm and dry. Rash noted.    Review of Systems  Skin: Positive for rash.  Psychiatric/Behavioral: Positive for substance abuse. Negative for depression, hallucinations and suicidal ideas. The patient is not nervous/anxious.   All other systems reviewed and are negative.   Blood pressure 133/89, pulse 72, temperature 97.9 F (36.6 C), temperature source Oral, resp. rate 18, height 5\' 8"  (1.727 m), weight 196 lb (88.9 kg), SpO2 100 %.Body mass index is 29.8 kg/m.  General Appearance: Fairly Groomed  Eye Contact:  Good  Speech:  Clear and Coherent  Volume:  Normal  Mood:  better  Affect:  occsionally down, otherwise euthymic  Thought Process:  Linear,   Orientation:  Full (Time, Place, and Person)  Thought Content:  denies Perceptions: denies AH/VH  Suicidal Thoughts:  No  Homicidal Thoughts:  No   Memory:  Immediate;   Fair Recent;   Fair Remote;   Fair  Judgement:  Fair  Insight:  Fair  Psychomotor Activity:  Normal No signs of EPS  Concentration:  Concentration: Fair  Recall:  [employer] of Knowledge:  Fair  Language:  Good  Akathisia:  No  Handed:  Right  AIMS (if indicated):     Assets:  Communication Skills Desire for Improvement Resilience Social Support  ADL's:  Intact  Cognition:  WNL  Sleep:  Number of Hours: 6   Assessment CONAL TREHARNE is a 45 y.o.  AA male  who has a hx of schizoaffective do , cocaine, cannabis , alcohol abuse,presented to [facility] under IVC by his sister Mihail Morelan [phone] for  suicidal ideations and substance abuse, plan to overdose in the setting of non adherence to medication.   There is significant improvement with his thought process and AH after uptitration of oral Prolixin. Next decanoate shot is due on 8/18;will continue current regimen at this time given significant improvement in his symptoms. Will increase mirtazapine to target his insomnia. Will discontinue prn prolixin.    Plans:  Reviewed past medical records,treatment plan.  Will continue Prolixin 10 mg po qhs for psychosis/mood sx.(EKG Qtc 422 msec on 8/8) Will plan for Prolixin decanoate to 25 mg IM q14 days - next dose 04/23/16.  Discontinue Prolixin 2.5 mg q12hprn for AH, agitation Will continue Amantadine 100 mg po bid for side effects of prolixin. Continue Tegretol 200 mg po bid for mood sx. Tegretol level on 04/13/16- therapeutic - 7.6 ug/ml Will continue Trazodone  100 mg po qhs prn for sleep. Increase Remeron 7.5 mg po qhs for sleep. Patient asks cane upon discharge; contacted PT. Awaiting to hear back from them.  Continue Ben gay topical for pain. Will continue to monitor vitals ,medication compliance and treatment side effects while patient is here.  Will monitor for medical issues as well as call consult as needed.  CSW will continue working on disposition. Patient to be referred to substance abuse program as needed. Patient to participate in therapeutic milieu .   Daily contact with patient to assess and evaluate symptoms and progress in treatment and Medication management      Norman Clay, MD 04/16/2016, 10:19 AM

## 2016-04-17 MED ORDER — FLUPHENAZINE HCL 10 MG PO TABS: 10.0000 mg | ORAL_TABLET | Freq: Every evening | ORAL | 0 refills | Status: DC

## 2016-04-17 MED ORDER — HYDROCHLOROTHIAZIDE 25 MG PO TABS: 25.0000 mg | ORAL_TABLET | Freq: Every day | ORAL | 0 refills | Status: DC

## 2016-04-17 MED ORDER — AMANTADINE HCL 100 MG PO CAPS: 100.0000 mg | ORAL_CAPSULE | Freq: Two times a day (BID) | ORAL | 0 refills | Status: DC

## 2016-04-17 MED ORDER — TRAZODONE HCL 100 MG PO TABS: 100.0000 mg | ORAL_TABLET | Freq: Every evening | ORAL | 0 refills | Status: DC | PRN

## 2016-04-17 MED ORDER — CARBAMAZEPINE ER 200 MG PO TB12: 200.0000 mg | ORAL_TABLET | Freq: Two times a day (BID) | ORAL | 0 refills | Status: DC

## 2016-04-17 MED ORDER — MIRTAZAPINE 15 MG PO TABS: 15.0000 mg | ORAL_TABLET | Freq: Every day | ORAL | 0 refills | Status: DC

## 2016-04-17 NOTE — Progress Notes (Signed)
[name] had been up and visible in milieu this evening, did attend and participate in evening group activity. Ryan Choi spoke about how how he should be getting discharged soon, he has denied SI this evening and has denied any A/V hallucinations and reports feeling better. Ryan Choi did complain about insomnia and spoke about how his medications were changed to help him sleep better. A.Support and encouragement provided. R. Safety maintained, will continue to monitor.

## 2016-04-17 NOTE — BHH Suicide Risk Assessment (Signed)
[facility] Discharge Suicide Risk Assessment   Principal Problem: Schizoaffective disorder, bipolar type [facility]) Discharge Diagnoses:  Patient Active Problem List   Diagnosis Date Noted  . Hyperprolactinemia ([place]) [E22.1] 04/26/2015  . HTN (hypertension) [I10] 04/24/2015  . Hyperlipidemia [E78.5] 04/24/2015  . Cocaine use disorder, moderate, dependence ([place]) [F14.20] 04/24/2015  . Alcohol use disorder, moderate, dependence ([place]) [F10.20] 04/24/2015  . Cannabis use disorder, severe, dependence ([place]) [F12.20] 04/24/2015  . Tobacco use disorder [F17.200] 04/24/2015  . Schizoaffective disorder, bipolar type ([place]) [F25.0] 04/23/2015  . ERECTILE DYSFUNCTION [F52.8] 06/05/2007    Total Time spent with patient: 30 minutes  Musculoskeletal: Strength & Muscle Tone: within normal limits Gait & Station: normal Patient leans: no lean  Psychiatric Specialty Exam: Review of Systems  Skin: Positive for rash.  Psychiatric/Behavioral: Positive for depression. Negative for hallucinations, substance abuse and suicidal ideas. The patient is nervous/anxious and has insomnia.     Blood pressure (!) 144/84, pulse 100, temperature 97.9 F (36.6 C), temperature source Oral, resp. rate 18, height 5\' 8"  (1.727 m), weight 88.9 kg (196 lb), SpO2 100 %.Body mass index is 29.8 kg/m.  General Appearance: Casual  Eye Contact::  Good  Speech:  Clear and Coherent and Slow409  Volume:  Decreased  Mood:  Anxious and Depressed  Affect:  Congruent  Thought Process:  Goal Directed  Orientation:  Full (Time, Place, and Person)  Thought Content:  Logical  Suicidal Thoughts:  No  Homicidal Thoughts:  No  Memory:  Immediate;   Fair Recent;   Fair Remote;   Fair  Judgement:  Fair  Insight:  Fair  Psychomotor Activity:  Normal  Concentration:  Good  Recall:  Good  Fund of Knowledge:Good  Language: Good  Akathisia:  No  Handed:  Right  AIMS (if indicated):     Assets:  Communication Skills Desire for  Improvement Resilience Social Support  Sleep:  Number of Hours: 6.75  Cognition: WNL  ADL's:  Intact   Mental Status Per Nursing Assessment::   On Admission:   Ryan Choi a 45 y.o. AA male who has a hx of schizoaffective do , cocaine, cannabis , alcohol abuse,presented to [facility] under IVC by his sister Ryan Choi [phone]) for  suicidal ideations and substance abuse, plan to overdose in the setting of non adherence to medication.   Demographic Factors:  Male, Low socioeconomic status and Unemployed  Loss Factors: Financial problems/change in socioeconomic status  Historical Factors: Impulsivity  Risk Reduction Factors:   Sense of responsibility to family and Living with another person, especially a relative  Continued Clinical Symptoms:  Depression More than one psychiatric diagnosis Unstable or Poor Therapeutic Relationship Previous Psychiatric Diagnoses and Treatments  Today reports continued depression and anxiety but denies SIHI. Reports he has access to guns but they are not located at his cousins house where he will live on d/c. Reports significant support from family. He plans to take meds, f/up with his psychiatrist and go to [facility] groups. He feels ready for d/c home today.   Cognitive Features That Contribute To Risk:  Closed-mindedness    Suicide Risk:  Minimal: No identifiable suicidal ideation.  Patients presenting with no risk factors but with morbid ruminations; may be classified as minimal risk based on the severity of the depressive symptoms  Follow-up Information    [facility] .   Specialty:  Behavioral Health Why:  Go to the walk-in clinc M-F between 8 and 10 for your hospital follow up appointment if things do  not work out with [facility] information: [place] [address] [phone]        [facility] ACT team .   Why:  You will need to call and ask for Ryan Choi on Monday.  I FAXed your info. to them, but he was  not in on Thursday ro Friday. Contact information: [facility] .   Why:  I faxed your info to them on Friday.  I had to leave a message.  You will need to call Ryan Choi on Monday to set up a screening appointment.   Contact information: [place] [phone]          Plan Of Care/Follow-up recommendations:  Activity:  as tolerated Diet:  resume normal diet Tests:  none Other:  f/up with psychiatrist and take meds as prescribed. avoid illicit drugs and alcohol  Charlcie Cradle, MD 04/17/2016, 8:14 AM

## 2016-04-17 NOTE — Discharge Summary (Signed)
Physician Discharge Summary Note  Patient:  Ryan Choi is an 45 y.o., male MRN:  [id number] DOB:  [birth year] Patient phone:  [phone] (home)  Patient address:   [address] [place] [address],  Total Time spent with patient: 30 minutes  Date of Admission:  04/09/2016 Date of Discharge: 04/17/2016   Reason for Admission:  Danger to self. Respondent is diagnosed with Schizophrenia and is prescribed medication for that condition but does not take medication as prescribed. Was found with a pocket full of pills that were not his medication. Petitioner believes he uses crack and weed in addition to unknown pills, on a regular basis. Respondent has been previously committed at mental health, behavioral health, and [facility]. Respondent has been hearing voices. "I feel like doing something to myself, I feel like hurting myself" he has also threatened to hurt other individuals that have tried to help him. Patient states that "she thought I spent $217 on crack in three days, but that's not true, I got a $20 bag and smoked a little bit of weed but I have been drinking. I've been off my medicine." Patient states that he is does not know what medications he is prescribed and states that he has not taken them since July 14 but states that he has gotten a shot about three weeks ago but thinks that he needs another one. Patient states that he is getting prolixin via shot but states "it's not working." Patient states that he would like to get back on his medications. Patient denies SI and history of attempts. Patient denies statements about hurting himself on the IVC. Patient denies HI and states that he thought about hurting someone while in the [facility] program in [place]. Patient states that he has had auditory hallucinations since 1994. When asked about the nature of the voices he reports that the voices "tell me to do things" and when asked about the type of things he states "it depends  on how mad the person makes me, the voices tell me to do bad things." patient states that he also thinks that he has "visions" of things that have not happened yet stating, "one time I had a dream that somebody was chasing me, but it really happened." Patient states that he uses "crack" cocaine "when I want to have sex" and states that he uses it about twice a month. Patient states that he uses THC daily. Patient states that he previously drank "all day everyday" but states that he no longer likes the taste of alcohol and does not drink that often. Patient denies allegations of IVC regarding medications states "I don't take pills unless it's my pills, I don;t like pills and stuff like that." Patient UDS + cocaine, + THC. Patient BAL 9 at time of assessment  Principal Problem: Schizoaffective disorder, bipolar type [facility]) Discharge Diagnoses: Patient Active Problem List   Diagnosis Date Noted  . Hyperprolactinemia ([place]) [E22.1] 04/26/2015  . HTN (hypertension) [I10] 04/24/2015  . Hyperlipidemia [E78.5] 04/24/2015  . Cocaine use disorder, moderate, dependence ([place]) [F14.20] 04/24/2015  . Alcohol use disorder, moderate, dependence ([place]) [F10.20] 04/24/2015  . Cannabis use disorder, severe, dependence ([place]) [F12.20] 04/24/2015  . Tobacco use disorder [F17.200] 04/24/2015  . Schizoaffective disorder, bipolar type ([place]) [F25.0] 04/23/2015  . ERECTILE DYSFUNCTION [F52.8] 06/05/2007    Past Psychiatric History: See H&P  Past Medical History:  Past Medical History:  Diagnosis Date  . Hypercholesteremia   . Hypertension   . Schizo affective  schizophrenia [facility])     Past Surgical History:  Procedure Laterality Date  . CHOLECYSTECTOMY     Family History:  Family History  Problem Relation Age of Onset  . Hypertension Mother   . Diabetes Mother   . Mental illness Neg Hx    Family Psychiatric  History: See above Social History:  History  Alcohol Use  . 1.2 oz/week  . 2 Cans of beer per week      History  Drug Use  . Types: Cocaine, Marijuana    Social History   Social History  . Marital status: Single    Spouse name: N/A  . Number of children: N/A  . Years of education: N/A   Social History Main Topics  . Smoking status: Current Every Day Smoker    Packs/day: 0.50    Types: Cigarettes  . Smokeless tobacco: Never Used  . Alcohol use 1.2 oz/week    2 Cans of beer per week  . Drug use:     Types: Cocaine, Marijuana  . Sexual activity: Not Asked   Other Topics Concern  . None   Social History Narrative  . None    Hospital Course: MACKS QUIGLEY was admitted for Schizoaffective disorder, bipolar type [facility]) and crisis management.  Pt was treated discharged with the medications listed below under Medication List.  Medical problems were identified and treated as needed.  Home medications were restarted as appropriate.  Improvement was monitored by observation and Sena Slate 's daily report of symptom reduction.  Emotional and mental status was monitored by daily self-inventory reports completed by Sena Slate and clinical staff.         Sena Slate was evaluated by the treatment team for stability and plans for continued recovery upon discharge. BURNETTE GARVIE 's motivation was an integral factor for scheduling further treatment. Employment, transportation, bed availability, health status, family support, and any pending legal issues were also considered during hospital stay. Pt was offered further treatment options upon discharge including but not limited to Residential, Intensive Outpatient, and Outpatient treatment.  Sena Slate will follow up with the services as listed below under Follow Up Information.     Upon completion of this admission the patient was both mentally and medically stable for discharge denying suicidal/homicidal ideation, auditory/visual/tactile hallucinations, delusional thoughts and paranoia.     Sena Slate  responded well to treatment with Prolixin 10 mg, Tegretol 200 mg  and Remeron 15 mg and trazodone 100 mg without adverse effects. Pt demonstrated improvement without reported or observed adverse effects to the point of stability appropriate for outpatient management. Pertinent labs include: Lipid panel, prolactin, CBC  for which outpatient follow-up is necessary for lab recheck as mentioned below. Reviewed CBC, CMP, BAL, and UDS; all unremarkable aside from noted exceptions.   Physical Findings: AIMS: Facial and Oral Movements Muscles of Facial Expression: None, normal Lips and Perioral Area: None, normal Jaw: None, normal Tongue: None, normal,Extremity Movements Upper (arms, wrists, hands, fingers): None, normal Lower (legs, knees, ankles, toes): None, normal, Trunk Movements Neck, shoulders, hips: None, normal, Overall Severity Severity of abnormal movements (highest score from questions above): None, normal Incapacitation due to abnormal movements: None, normal Patient's awareness of abnormal movements (rate only patient's report): No Awareness, Dental Status Current problems with teeth and/or dentures?: No Does patient usually wear dentures?: No  CIWA:    COWS:     Musculoskeletal: Strength & Muscle Tone: within normal limits Gait &  Station: normal Patient leans: N/A  Psychiatric Specialty Exam:  See SRA by MD Physical Exam  ROS  Blood pressure (!) 136/94, pulse 82, temperature 97.8 F (36.6 C), temperature source Oral, resp. rate 18, height 5\' 8"  (1.727 m), weight 88.9 kg (196 lb), SpO2 100 %.Body mass index is 29.8 kg/m.   Have you used any form of tobacco in the last 30 days? (Cigarettes, Smokeless Tobacco, Cigars, and/or Pipes): Yes  Has this patient used any form of tobacco in the last 30 days? (Cigarettes, Smokeless Tobacco, Cigars, and/or Pipes) Yes, Yes, A prescription for an FDA-approved tobacco cessation medication was offered at discharge and the patient  refused  Blood Alcohol level:  Lab Results  Component Value Date   ETH 9 (H) 04/08/2016   ETH <5 [id number]    Metabolic Disorder Labs:  Lab Results  Component Value Date   HGBA1C 5.3 04/12/2016   MPG 105 04/12/2016   MPG 108 04/25/2015   Lab Results  Component Value Date   PROLACTIN 24.7 (H) 04/12/2016   PROLACTIN 19.3 (H) 04/25/2015   Lab Results  Component Value Date   CHOL 197 04/12/2016   TRIG 166 (H) 04/12/2016   HDL 34 (L) 04/12/2016   CHOLHDL 5.8 04/12/2016   VLDL 33 04/12/2016   LDLCALC 130 (H) 04/12/2016   LDLCALC 126 (H) 04/25/2015    See Psychiatric Specialty Exam and Suicide Risk Assessment completed by Attending Physician prior to discharge.  Discharge destination:  Home  Is patient on multiple antipsychotic therapies at discharge:  No   Has Patient had three or more failed trials of antipsychotic monotherapy by history:  No  Recommended Plan for Multiple Antipsychotic Therapies: NA  Discharge Instructions    Diet - low sodium heart healthy    Complete by:  As directed   Discharge instructions    Complete by:  As directed   Take all medications as prescribed. Keep all follow-up appointments as scheduled.  Do not consume alcohol or use illegal drugs while on prescription medications. Report any adverse effects from your medications to your primary care provider promptly.  In the event of recurrent symptoms or worsening symptoms, call 911, a crisis hotline, or go to the nearest emergency department for evaluation.   Increase activity slowly    Complete by:  As directed       Medication List    STOP taking these medications   buPROPion 75 MG tablet Commonly known as:  WELLBUTRIN   busPIRone 10 MG tablet Commonly known as:  BUSPAR   fluPHENAZine decanoate 25 MG/ML injection Commonly known as:  PROLIXIN   hydrOXYzine 25 MG tablet Commonly known as:  ATARAX/VISTARIL   meloxicam 7.5 MG tablet Commonly known as:  MOBIC     TAKE these  medications     Indication  albuterol 108 (90 Base) MCG/ACT inhaler Commonly known as:  PROVENTIL HFA;VENTOLIN HFA Inhale 1-2 puffs into the lungs every 6 (six) hours as needed for wheezing or shortness of breath.  Indication:  Asthma   amantadine 100 MG capsule Commonly known as:  SYMMETREL Take 1 capsule (100 mg total) by mouth 2 (two) times daily.  Indication:  Extrapyramidal Reaction caused by Medications   carbamazepine 200 MG 12 hr tablet Commonly known as:  TEGRETOL XR Take 1 tablet (200 mg total) by mouth 2 (two) times daily. What changed:  Another medication with the same name was removed. Continue taking this medication, and follow the directions you see here.  Indication:  Schizoaffective Disorder   fluPHENAZine 10 MG tablet Commonly known as:  PROLIXIN Take 1 tablet (10 mg total) by mouth every evening. What changed:  medication strength  how much to take  Indication:  Psychosis   hydrochlorothiazide 25 MG tablet Commonly known as:  HYDRODIURIL Take 1 tablet (25 mg total) by mouth daily.  Indication:  High Blood Pressure Disorder   mirtazapine 15 MG tablet Commonly known as:  REMERON Take 1 tablet (15 mg total) by mouth at bedtime. What changed:  Another medication with the same name was removed. Continue taking this medication, and follow the directions you see here.  Indication:  Trouble Sleeping   nicotine 21 mg/24hr patch Commonly known as:  NICODERM CQ - dosed in mg/24 hours Place 1 patch (21 mg total) onto the skin daily.  Indication:  Nicotine Addiction   traZODone 100 MG tablet Commonly known as:  DESYREL Take 1 tablet (100 mg total) by mouth at bedtime as needed for sleep.  Indication:  Trouble Sleeping      Follow-up Information    [facility] .   Specialty:  Behavioral Health Why:  Go to the walk-in clinc M-F between 8 and 10 for your hospital follow up appointment if things do not work out with [facility] information: [place] [address] [phone]        [facility] ACT team .   Why:  You will need to call and ask for [place] on Monday.  I FAXed your info. to them, but he was not in on Thursday ro Friday. Contact information: [place] .   Why:  I faxed your info to them on Friday.  I had to leave a message.  You will need to call Hoyle Sauer on Monday to set up a screening appointment.   Contact information: [place] [phone]          Follow-up recommendations:  Activity:  as tolerated Diet:  heart healthy  Comments:  Take all medications as prescribed. Keep all follow-up appointments as scheduled.  Do not consume alcohol or use illegal drugs while on prescription medications. Report any adverse effects from your medications to your primary care provider promptly.  In the event of recurrent symptoms or worsening symptoms, call 911, a crisis hotline, or go to the nearest emergency department for evaluation.   Signed: Derrill Center, NP 04/17/2016, 9:12 AM

## 2016-04-17 NOTE — Progress Notes (Signed)
D: Patient denies SI/HI or AVH.  Pt. Is calm, cooperative and visualized interacting with others and staff.  Pt  Reported that he did not sleep as well last night because his medication was not strong enough.  Pt. States that he is ready for discharge, denies any physical complaints.  A: Patient given emotional support from RN. Patient encouraged to come to staff with concerns and/or questions. Patient's medication routine continued. Patient's orders and plan of care reviewed.   R: Patient remains appropriate and cooperative. Will continue to monitor patient q15 minutes for safety.

## 2016-04-17 NOTE — BHH Group Notes (Signed)
[facility] Group Notes:  (Nursing/MHT/Case Management/Adjunct)  Date:  04/17/2016  Time:  10:31 AM  Type of Therapy:  Nurse Education  Participation Level:  Did Not Attend   Cheri Kearns 04/17/2016, 10:31 AM

## 2016-04-17 NOTE — Progress Notes (Signed)
Adult Psychoeducational Group Note  Date:  04/17/2016 Time:  9:50 PM  Group Topic/Focus:  Wrap-Up Group:   The focus of this group is to help patients review their daily goal of treatment and discuss progress on daily workbooks.   Participation Level:  Active  Participation Quality:  Appropriate  Affect:  Appropriate  Cognitive:  Appropriate  Insight: Appropriate  Engagement in Group:  Engaged  Modes of Intervention:  Discussion  Additional Comments:  The patient expressed that he attended groups.The patient also said that he rates today a 9 which is good. Nash Shearer 04/17/2016, 9:50 PM

## 2016-04-17 NOTE — BHH Group Notes (Signed)
[facility] Group Notes:  (Clinical Social Work)  04/17/2016  11:15-12:00PM  Summary of Progress/Problems:   Today's process group involved patients discussing ideas of what they can do to prevent future hospitalizations such as this one.  One possible action that was mentioned by several patients was "take my medicine."  Therefore, the whiteboard was used to create a list of the difficulties with staying on medication, followed by corresponding possible positive solutions to those specific difficulties.  The patient expressed that one thing s/he can actively do to not become sick and have to be hospitalized is "stay on my meds."  He contributed healthy comments to the group, is looking forward to d/c later today.  Type of Therapy:  Group Therapy - Process  Participation Level:  Active  Participation Quality:  Attentive, Sharing and Supportive  Affect:  Appropriate  Cognitive:  Appropriate  Insight:  Developing/Improving  Engagement in Therapy:  Engaged  Modes of Intervention:  Exploration, Discussion  Selmer Dominion, LCSW 04/17/2016, 12:56 PM

## 2016-04-17 NOTE — Progress Notes (Signed)
D: Ryan Choi rates Anxiety 10/10. He thought he was going to be discharged tonight around 8pm and he was very upset to realize his discharge is likely tomorrow. It took several minutes to calm him down. He called his sister and she will be ready to pick him up around 3pm. Denies SI/HI/AVH at this time. Contracts for safety.  A: Encouragement and support given. Q15 minute room checks for patient safety. Medications administered as prescribed.  R: Continue to monitor for patient safety and medication effectiveness.

## 2016-04-18 NOTE — BHH Suicide Risk Assessment (Signed)
Pt seen today and chart reviewed. Pt was supposed to be d/c home yesterday but reports his sister did not pick him up. His sister will pick him up today at 3pm.  Reports he is doing well. He is a little depressed but it is better as compared to yesterday. He denies anxiety. Denies SI/HI/AVH. He is not sleeping well at night and energy is low. He is ready for d/c home today. Denies SE from meds.

## 2016-04-18 NOTE — BHH Group Notes (Signed)
[facility] Group Notes: (Clinical Social Work)   04/18/2016      Type of Therapy:  Group Therapy   Participation Level:  Did Not Attend despite MHT prompting   Selmer Dominion, LCSW 04/18/2016, 12:11 PM

## 2016-04-18 NOTE — BHH Group Notes (Signed)
[facility] Group Notes:  (Nursing/MHT/Case Management/Adjunct)  Date:  04/18/2016  Time:  11:21 AM  Type of Therapy:  Nurse Education  Participation Level:  Did Not Attend  Cheri Kearns 04/18/2016, 11:21 AM

## 2016-04-18 NOTE — Progress Notes (Signed)
Data. Patient denies SI/HI/AVH.   Patient interacting well with staff and other patients.  Action. Emotional support and encouragement offered. Education provided on medication, indications and side effect. Q 15 minute checks done for safety. Response. Safety on the unit maintained through 15 minute checks.  Medications taken as prescribed. Attended groups. Remained calm and appropriate through out shift. Patient refused to complete his self assessment. "I'm going home".  Pt. discharged to lobby, with taxi voucher Belongings sheet reviewed and signed by pt. and all belongings sent home, including scripts and medications that were in his locker. Paperwork reviewed and pt. able to verbalize understanding of education. Pt. in no current distress and ambulatory.

## 2016-06-07 ENCOUNTER — Emergency Department (HOSPITAL_COMMUNITY)
Admission: EM | Admit: 2016-06-07 | Discharge: 2016-06-08 | Disposition: A | Discharge status: Home / Self Care | Payer: MEDICAID | Attending: Emergency Medicine | Admitting: Emergency Medicine

## 2016-06-07 DIAGNOSIS — F209 Schizophrenia, unspecified: Secondary | ICD-10-CM | POA: Diagnosis not present

## 2016-06-07 DIAGNOSIS — F1290 Cannabis use, unspecified, uncomplicated: Secondary | ICD-10-CM | POA: Diagnosis not present

## 2016-06-07 DIAGNOSIS — F250 Schizoaffective disorder, bipolar type: Secondary | ICD-10-CM | POA: Diagnosis not present

## 2016-06-07 DIAGNOSIS — F1490 Cocaine use, unspecified, uncomplicated: Secondary | ICD-10-CM | POA: Diagnosis not present

## 2016-06-07 DIAGNOSIS — F17210 Nicotine dependence, cigarettes, uncomplicated: Secondary | ICD-10-CM | POA: Diagnosis not present

## 2016-06-07 DIAGNOSIS — E876 Hypokalemia: Secondary | ICD-10-CM | POA: Insufficient documentation

## 2016-06-07 DIAGNOSIS — F1420 Cocaine dependence, uncomplicated: Secondary | ICD-10-CM | POA: Diagnosis present

## 2016-06-07 DIAGNOSIS — I10 Essential (primary) hypertension: Secondary | ICD-10-CM | POA: Insufficient documentation

## 2016-06-07 DIAGNOSIS — Z79899 Other long term (current) drug therapy: Secondary | ICD-10-CM | POA: Insufficient documentation

## 2016-06-07 DIAGNOSIS — Z818 Family history of other mental and behavioral disorders: Secondary | ICD-10-CM | POA: Diagnosis not present

## 2016-06-07 DIAGNOSIS — Z8249 Family history of ischemic heart disease and other diseases of the circulatory system: Secondary | ICD-10-CM | POA: Diagnosis not present

## 2016-06-07 DIAGNOSIS — Z046 Encounter for general psychiatric examination, requested by authority: Secondary | ICD-10-CM | POA: Diagnosis present

## 2016-06-07 LAB — COMPREHENSIVE METABOLIC PANEL
ALK PHOS: 64 U/L (ref 38–126)
ALT: 21 U/L (ref 17–63)
AST: 21 U/L (ref 15–41)
Albumin: 4.1 g/dL (ref 3.5–5.0)
Anion gap: 7 (ref 5–15)
BILIRUBIN TOTAL: 0.8 mg/dL (ref 0.3–1.2)
BUN: 11 mg/dL (ref 6–20)
CALCIUM: 9 mg/dL (ref 8.9–10.3)
CO2: 29 mmol/L (ref 22–32)
CREATININE: 1.07 mg/dL (ref 0.61–1.24)
Chloride: 100 mmol/L — ABNORMAL LOW (ref 101–111)
Glucose, Bld: 110 mg/dL — ABNORMAL HIGH (ref 65–99)
Potassium: 3.1 mmol/L — ABNORMAL LOW (ref 3.5–5.1)
Sodium: 136 mmol/L (ref 135–145)
Total Protein: 7.7 g/dL (ref 6.5–8.1)

## 2016-06-07 LAB — ETHANOL: ALCOHOL ETHYL (B): 6 mg/dL — AB (ref <5)

## 2016-06-07 LAB — CBC
HCT: 46.4 % (ref 39.0–52.0)
Hemoglobin: 15.1 g/dL (ref 13.0–17.0)
MCH: 26.9 pg (ref 26.0–34.0)
MCHC: 32.5 g/dL (ref 30.0–36.0)
MCV: 82.6 fL (ref 78.0–100.0)
PLATELETS: 339 10*3/uL (ref 150–400)
RBC: 5.62 MIL/uL (ref 4.22–5.81)
RDW: 15 % (ref 11.5–15.5)
WBC: 11.5 10*3/uL — ABNORMAL HIGH (ref 4.0–10.5)

## 2016-06-07 LAB — RAPID URINE DRUG SCREEN, HOSP PERFORMED
AMPHETAMINES: NOT DETECTED
Barbiturates: NOT DETECTED
Benzodiazepines: NOT DETECTED
Cocaine: POSITIVE — AB
OPIATES: NOT DETECTED
TETRAHYDROCANNABINOL: POSITIVE — AB

## 2016-06-07 LAB — ACETAMINOPHEN LEVEL: Acetaminophen (Tylenol), Serum: <10 ug/mL — ABNORMAL LOW (ref 10–30)

## 2016-06-07 LAB — SALICYLATE LEVEL

## 2016-06-07 MED ORDER — POTASSIUM CHLORIDE CRYS ER 20 MEQ PO TBCR
80.0000 meq | EXTENDED_RELEASE_TABLET | Freq: Once | ORAL | Status: AC
  Administered 2016-06-07: 80 meq via ORAL
  Filled 2016-06-07: qty 4

## 2016-06-07 NOTE — ED Notes (Signed)
Bed: [facility] Expected date:  Expected time:  Means of arrival:  Comments: Ryan Choi

## 2016-06-07 NOTE — ED Notes (Signed)
Bed: WTR5 Expected date:  Expected time:  Means of arrival:  Comments: 

## 2016-06-07 NOTE — ED Provider Notes (Signed)
[name] DEPT Provider Note   CSN: [id number] Arrival date & time: 06/07/16  1939 By signing my name below, I, Dyke Brackett, attest that this documentation has been prepared under the direction and in the presence of non-physician practitioner, Antonietta Breach, PA-C Electronically Signed: Dyke Brackett, Scribe. 06/07/2016. 9:14 PM.    History   Chief Complaint Chief Complaint  Patient presents with  . IVC    HPI Ryan Choi is a 45 y.o. male with hx of schizoaffective schizophrenia, bipolar type who presents to the Emergency Department with IVC paperwork. Per IVC papers, pt is currently off of his medications and is having auditory hallucinations. He is using crack cocaine, marijuana, and alcohol. Pt is hostile and aggressive towards to his sister and is a danger to himself and others. Pt endorses alcohol and cocaine use today. He states he is currently having hallucinations. Pt denies any HI or SI.   The history is provided by the patient and medical records. No language interpreter was used.    Past Medical History:  Diagnosis Date  . Hypercholesteremia   . Hypertension   . Schizo affective schizophrenia [facility])     Patient Active Problem List   Diagnosis Date Noted  . Hyperprolactinemia ([place]) 04/26/2015  . HTN (hypertension) 04/24/2015  . Hyperlipidemia 04/24/2015  . Cocaine use disorder, moderate, dependence ([place]) 04/24/2015  . Alcohol use disorder, moderate, dependence ([place]) 04/24/2015  . Cannabis use disorder, severe, dependence ([place]) 04/24/2015  . Tobacco use disorder 04/24/2015  . Schizoaffective disorder, bipolar type ([place]) 04/23/2015  . ERECTILE DYSFUNCTION 06/05/2007    Past Surgical History:  Procedure Laterality Date  . CHOLECYSTECTOMY       Home Medications    Prior to Admission medications   Medication Sig Start Date End Date Taking? Authorizing Provider  ALPRAZolam Duanne Moron) 0.5 MG tablet Take 0.5 mg by mouth at bedtime. 05/26/16  Yes Historical  Provider, MD  amantadine (SYMMETREL) 100 MG capsule Take 1 capsule (100 mg total) by mouth 2 (two) times daily. 04/17/16  Yes Derrill Center, NP  carbamazepine (TEGRETOL XR) 200 MG 12 hr tablet Take 1 tablet (200 mg total) by mouth 2 (two) times daily. 04/17/16  Yes Derrill Center, NP  fluPHENAZine (PROLIXIN) 10 MG tablet Take 1 tablet (10 mg total) by mouth every evening. 04/17/16  Yes Derrill Center, NP  hydrochlorothiazide (HYDRODIURIL) 25 MG tablet Take 1 tablet (25 mg total) by mouth daily. 04/17/16  Yes Derrill Center, NP  mirtazapine (REMERON) 15 MG tablet Take 1 tablet (15 mg total) by mouth at bedtime. 04/17/16  Yes Derrill Center, NP  risperidone (RISPERDAL) 4 MG tablet Take 4 mg by mouth 2 (two) times daily. 05/26/16  Yes Historical Provider, MD  risperiDONE microspheres (RISPERDAL CONSTA) 50 MG injection Inject 50 mg into the muscle every 14 (fourteen) days.   Yes Historical Provider, MD  traZODone (DESYREL) 100 MG tablet Take 1 tablet (100 mg total) by mouth at bedtime as needed for sleep. 04/17/16  Yes Derrill Center, NP  albuterol (PROVENTIL HFA;VENTOLIN HFA) 108 (90 BASE) MCG/ACT inhaler Inhale 1-2 puffs into the lungs every 6 (six) hours as needed for wheezing or shortness of breath. Patient not taking: Reported on 06/07/2016 02/12/15   Leo Grosser, MD  nicotine (NICODERM CQ - DOSED IN MG/24 HOURS) 21 mg/24hr patch Place 1 patch (21 mg total) onto the skin daily. Patient not taking: Reported on 04/08/2016 04/29/15   Niel Hummer, NP    Family  History Family History  Problem Relation Age of Onset  . Hypertension Mother   . Diabetes Mother   . Mental illness Neg Hx     Social History Social History  Substance Use Topics  . Smoking status: Current Every Day Smoker    Packs/day: 0.50    Types: Cigarettes  . Smokeless tobacco: Never Used  . Alcohol use 1.2 oz/week    2 Cans of beer per week     Allergies   Ibuprofen; Omeprazole; and Tylenol [acetaminophen]   Review of  Systems Review of Systems 10 systems reviewed and all are negative for acute change except as noted in the HPI.   Physical Exam Updated Vital Signs BP 141/91 (BP Location: Right Arm)   Pulse 79   Temp 98.8 F (37.1 C) (Oral)   Resp 20   SpO2 99%   Physical Exam  Constitutional: He is oriented to person, place, and time. He appears well-developed and well-nourished. No distress.  Patient in NAD  HENT:  Head: Normocephalic and atraumatic.  Eyes: Conjunctivae and EOM are normal. No scleral icterus.  Neck: Normal range of motion.  Pulmonary/Chest: Effort normal. No respiratory distress.  Musculoskeletal: Normal range of motion.  Neurological: He is alert and oriented to person, place, and time.  Skin: Skin is warm and dry. No rash noted. He is not diaphoretic. No erythema. No pallor.  Psychiatric: He has a normal mood and affect. His behavior is normal. He expresses no homicidal and no suicidal ideation.  Patient calm and cooperative  Nursing note and vitals reviewed.   ED Treatments / Results  DIAGNOSTIC STUDIES:  Oxygen Saturation is 99% on RA, normal by my interpretation.    COORDINATION OF CARE:  9:18 PM Discussed treatment plan with pt at bedside and pt agreed to plan.  Labs (all labs ordered are listed, but only abnormal results are displayed) Labs Reviewed  COMPREHENSIVE METABOLIC PANEL - Abnormal; Notable for the following:       Result Value   Potassium 3.1 (*)    Chloride 100 (*)    Glucose, Bld 110 (*)    All other components within normal limits  ETHANOL - Abnormal; Notable for the following:    Alcohol, Ethyl (B) 6 (*)    All other components within normal limits  ACETAMINOPHEN LEVEL - Abnormal; Notable for the following:    Acetaminophen (Tylenol), Serum <10 (*)    All other components within normal limits  CBC - Abnormal; Notable for the following:    WBC 11.5 (*)    All other components within normal limits  URINE RAPID DRUG SCREEN, HOSP PERFORMED -  Abnormal; Notable for the following:    Cocaine POSITIVE (*)    Tetrahydrocannabinol POSITIVE (*)    All other components within normal limits  SALICYLATE LEVEL    EKG  EKG Interpretation None       Radiology No results found.  Procedures Procedures (including critical care time)  Medications Ordered in ED Medications  potassium chloride SA (K-DUR,KLOR-CON) CR tablet 80 mEq (80 mEq Oral Given 06/07/16 2143)     Initial Impression / Assessment and Plan / ED Course  I have reviewed the triage vital signs and the nursing notes.  Pertinent labs & imaging results that were available during my care of the patient were reviewed by me and considered in my medical decision making (see chart for details).  Clinical Course    Patient medically cleared; here under IVC.  He meets criteria for  inpatient management.  TTS to seek placement.  Disposition to be determined by oncoming ED provider.   Final Clinical Impressions(s) / ED Diagnoses   Final diagnoses:  Schizophrenia, unspecified type ([place])    New Prescriptions New Prescriptions   No medications on file   I personally performed the services described in this documentation, which was scribed in my presence. The recorded information has been reviewed and is accurate.      Antonietta Breach, PA-C 06/08/16 [id number]    Daleen Bo, MD 06/08/16 2126

## 2016-06-07 NOTE — ED Triage Notes (Signed)
Pt BIB [facility] after being IVC'd by his brother for not taking his schizoaffective medications. Pt has also, per paperwork, been using marijuana and alcohol. Per report pt is hostile and aggressive and a danger to others. Alert.

## 2016-06-07 NOTE — ED Notes (Signed)
Pt presents with aggressive behavior exhibited towards family.  Family took out IVC papers on pt.  Pt noncompliant with meds, denies SI or HI, admits to AVH, stating he will not watch TV because of the voices.  Denies feeling hopeless.  Reports diagnosed with Schizoaffective DO and Bipolar DO. Awake, alert & responsive, no distress noted, calm & cooperative at present.  Monitoring for safety, Q 15 min checks in effect.

## 2016-06-07 NOTE — BH Assessment (Signed)
Assessor attempted to assess pt, however he is currently getting his 2nd security check. Advised will conduct assessment once pt is stabilized in room.  Lind Covert, MSW, Latanya Presser

## 2016-06-07 NOTE — ED Notes (Signed)
PT SAFETY SCREENED FOR CONTRABAND.  TTS at bedside to eval pt prior to RN eval.
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
## 2016-06-08 DIAGNOSIS — F1490 Cocaine use, unspecified, uncomplicated: Secondary | ICD-10-CM

## 2016-06-08 DIAGNOSIS — Z8249 Family history of ischemic heart disease and other diseases of the circulatory system: Secondary | ICD-10-CM

## 2016-06-08 DIAGNOSIS — Z818 Family history of other mental and behavioral disorders: Secondary | ICD-10-CM | POA: Diagnosis not present

## 2016-06-08 DIAGNOSIS — F250 Schizoaffective disorder, bipolar type: Secondary | ICD-10-CM | POA: Diagnosis not present

## 2016-06-08 DIAGNOSIS — F1420 Cocaine dependence, uncomplicated: Secondary | ICD-10-CM | POA: Diagnosis not present

## 2016-06-08 DIAGNOSIS — F1290 Cannabis use, unspecified, uncomplicated: Secondary | ICD-10-CM

## 2016-06-08 LAB — BASIC METABOLIC PANEL
Anion gap: 5 (ref 5–15)
BUN: 13 mg/dL (ref 6–20)
CALCIUM: 8.8 mg/dL — AB (ref 8.9–10.3)
CO2: 27 mmol/L (ref 22–32)
CREATININE: 0.92 mg/dL (ref 0.61–1.24)
Chloride: 106 mmol/L (ref 101–111)
Glucose, Bld: 126 mg/dL — ABNORMAL HIGH (ref 65–99)
Potassium: 3.9 mmol/L (ref 3.5–5.1)
SODIUM: 138 mmol/L (ref 135–145)

## 2016-06-08 MED ORDER — TRAZODONE HCL 100 MG PO TABS: 100.0000 mg | ORAL_TABLET | Freq: Every evening | ORAL | 0 refills | Status: AC | PRN

## 2016-06-08 MED ORDER — RISPERIDONE 2 MG PO TABS
4.0000 mg | ORAL_TABLET | Freq: Two times a day (BID) | ORAL | Status: DC
  Administered 2016-06-08: 4 mg via ORAL
  Filled 2016-06-08: qty 2

## 2016-06-08 MED ORDER — MIRTAZAPINE 15 MG PO TABS: 15.0000 mg | ORAL_TABLET | Freq: Every day | ORAL | 0 refills | Status: AC

## 2016-06-08 MED ORDER — RISPERIDONE 4 MG PO TABS: 4.0000 mg | ORAL_TABLET | Freq: Two times a day (BID) | ORAL | 0 refills | Status: AC

## 2016-06-08 MED ORDER — MIRTAZAPINE 30 MG PO TABS: 15.0000 mg | ORAL_TABLET | Freq: Every day | ORAL | Status: DC

## 2016-06-08 MED ORDER — AMANTADINE HCL 100 MG PO CAPS
100.0000 mg | ORAL_CAPSULE | Freq: Two times a day (BID) | ORAL | Status: DC
  Administered 2016-06-08: 100 mg via ORAL
  Filled 2016-06-08: qty 1

## 2016-06-08 MED ORDER — FLUPHENAZINE HCL 10 MG PO TABS
10.0000 mg | ORAL_TABLET | Freq: Every evening | ORAL | Status: DC
  Filled 2016-06-08: qty 1

## 2016-06-08 MED ORDER — CARBAMAZEPINE ER 200 MG PO TB12: 200.0000 mg | ORAL_TABLET | Freq: Two times a day (BID) | ORAL | 0 refills | Status: AC

## 2016-06-08 MED ORDER — FLUPHENAZINE HCL 10 MG PO TABS: 10.0000 mg | ORAL_TABLET | Freq: Every evening | ORAL | 0 refills | Status: AC

## 2016-06-08 MED ORDER — ALPRAZOLAM 0.5 MG PO TABS: 0.5000 mg | ORAL_TABLET | Freq: Every day | ORAL | Status: DC

## 2016-06-08 MED ORDER — CARBAMAZEPINE ER 200 MG PO TB12
200.0000 mg | ORAL_TABLET | Freq: Two times a day (BID) | ORAL | Status: DC
  Administered 2016-06-08: 200 mg via ORAL
  Filled 2016-06-08: qty 1

## 2016-06-08 MED ORDER — AMANTADINE HCL 100 MG PO CAPS: 100.0000 mg | ORAL_CAPSULE | Freq: Two times a day (BID) | ORAL | 0 refills | Status: AC

## 2016-06-08 MED ORDER — TRAZODONE HCL 100 MG PO TABS: 100.0000 mg | ORAL_TABLET | Freq: Every evening | ORAL | Status: DC | PRN

## 2016-06-08 MED ORDER — HYDROCHLOROTHIAZIDE 25 MG PO TABS: 25.0000 mg | ORAL_TABLET | Freq: Every day | ORAL | 0 refills | Status: AC

## 2016-06-08 MED ORDER — HYDROCHLOROTHIAZIDE 25 MG PO TABS
25.0000 mg | ORAL_TABLET | Freq: Every day | ORAL | Status: DC
  Administered 2016-06-08: 25 mg via ORAL
  Filled 2016-06-08: qty 1

## 2016-06-08 NOTE — ED Notes (Signed)
Pt d/c home per MD order. Discharge summary reviewed with pt. Pt verbalizes understanding of discharge summary. RX provided. Pt denies SI/HI. Pt denies AVH. Pt signed for personal property and property returned. Pt signed e-signature. Ambulatory off unit.

## 2016-06-08 NOTE — BH Assessment (Addendum)
[facility] Assessment Progress Note  Per Corena Pilgrim, MD, this pt does not require psychiatric hospitalization at this time.  Pt is to be discharged from [facility].  Pt is reportedly under consideration for ACT Team services through [facility], and this writer was asked to call them.  At 09:51 call was placed, and I spoke to [place], who transferred me to Lum Babe.  Elita Quick reports that they have submitted a request to the [facility], as third party payer, to consider pt for their ACT Team, and that [facility] declined.  He adds that he will contact pt's sister, Joseph Art, to ask her to appeal to [place].  These details have been staffed with Dr Darleene Cleaver, who continues to opine that pt requires ACT Team services at this time.  This Probation officer will contact [place].  For now, discharge instructions include contact information for [facility], and for [facility].  Pt's nurse, Caryl Pina, has been notified.  Jalene Mullet, MA Triage Specialist [phone]   Addendum:  Pt has signed Consent to Release Information to his sister, Randell Loop and to [facility].  Signed consent form has been placed in pt's hard chart.  Jalene Mullet, [place] Triage Specialist [phone]   Addendum:  Lum Babe indicates that pt's Medicaid behavioral health benefits may be through [facility], in the [place] area.  Given that this determines who would authorize ACT Team services for the pt, this writer called [facility] [phone]) and determined that they do, in fact, manage pt's behavioral health benefits.  Per Juan's previous request, I then faxed the psychiatry consult note to [facility], including a note on the cover sheet advising Elita Quick to pursue ACT Team authorization through [facility].  I then placed several calls to [place] in an effort to speak to him to discuss these details, but was not able to reach him.  Pt initially presents under IVC, which has been rescinded  by Dr Darleene Cleaver.  After staffing these details with Waylan Boga, DNP, it has been determined that pt is ready for discharge with the instructions noted above.  Pt's nurse, Caryl Pina, has been notified.  Jalene Mullet, [place] Triage Specialist [phone]

## 2016-06-08 NOTE — ED Notes (Signed)
Phlebotomist called for ordered lab  

## 2016-06-08 NOTE — Discharge Instructions (Signed)
For your ongoing behavioral health needs, you are advised to contact [facility] at your earliest opportunity:       [facility]      [address], [place] [address]      [phone]  If they are not able to provide treatment for you at this time, contact [facility]:       [facility]      [address], [place] [address]      [phone]      New and returning patients are seen at their walk-in clinic.  Walk-in hours are Monday - Friday from 8:00 am - 3:00 pm.  Walk-in patients are seen on a first come, first served basis.  Try to arrive as early as possible for he best chance of being seen the same day.

## 2016-06-08 NOTE — Progress Notes (Signed)
06/08/16 1356:  LRT went to pt room, pt was sleep.   Victorino Sparrow, LRT/CTRS

## 2016-06-08 NOTE — [facility] Suicide Risk Assessment (Signed)
Suicide Risk Assessment  Discharge Assessment   [facility] Discharge Suicide Risk Assessment   Principal Problem: Schizoaffective disorder, bipolar type [facility]) Discharge Diagnoses:  Patient Active Problem List   Diagnosis Date Noted  . Cocaine use disorder, moderate, dependence ([place]) [F14.20] 04/24/2015    Priority: High  . Schizoaffective disorder, bipolar type ([place]) [F25.0] 04/23/2015    Priority: High  . Hyperprolactinemia ([place]) [E22.1] 04/26/2015  . HTN (hypertension) [I10] 04/24/2015  . Hyperlipidemia [E78.5] 04/24/2015  . Alcohol use disorder, moderate, dependence ([place]) [F10.20] 04/24/2015  . Cannabis use disorder, severe, dependence ([place]) [F12.20] 04/24/2015  . Tobacco use disorder [F17.200] 04/24/2015  . ERECTILE DYSFUNCTION [F52.8] 06/05/2007    Total Time spent with patient: 45 minutes  Musculoskeletal: Strength & Muscle Tone: within normal limits Gait & Station: normal Patient leans: N/A  Psychiatric Specialty Exam: Physical Exam  Constitutional: He is oriented to person, place, and time. He appears well-developed and well-nourished.  HENT:  Head: Normocephalic.  Neck: Normal range of motion.  Respiratory: Effort normal.  Musculoskeletal: Normal range of motion.  Neurological: He is alert and oriented to person, place, and time.  Skin: Skin is warm.  Psychiatric: He has a normal mood and affect. His speech is normal and behavior is normal. Judgment and thought content normal. Cognition and memory are normal.    Review of Systems  Constitutional: Negative.   HENT: Negative.   Eyes: Negative.   Respiratory: Negative.   Cardiovascular: Negative.   Gastrointestinal: Negative.   Genitourinary: Negative.   Musculoskeletal: Negative.   Skin: Negative.   Neurological: Negative.   Endo/Heme/Allergies: Negative.   Psychiatric/Behavioral: Positive for substance abuse.    Blood pressure 143/76, pulse 70, temperature 98 F (36.7 C), temperature source Oral, resp. rate  17, SpO2 97 %.There is no height or weight on file to calculate BMI.  General Appearance: Casual  Eye Contact:  Good  Speech:  Normal Rate  Volume:  Normal  Mood:  Euthymic  Affect:  Congruent  Thought Process:  Coherent and Descriptions of Associations: Intact  Orientation:  Full (Time, Place, and Person)  Thought Content:  WDL  Suicidal Thoughts:  No  Homicidal Thoughts:  No  Memory:  Immediate;   Good Recent;   Good Remote;   Good  Judgement:  Fair  Insight:  Fair  Psychomotor Activity:  Normal  Concentration:  Concentration: Good and Attention Span: Good  Recall:  Good  Fund of Knowledge:  Good  Language:  Good  Akathisia:  No  Handed:  Right  AIMS (if indicated):     Assets:  Housing Leisure Time Physical Health Resilience Social Support  ADL's:  Intact  Cognition:  WNL  Sleep:       Mental Status Per Nursing Assessment::   On Admission: cocaine abuse with homicidal ideations    Demographic Factors:  Male  Loss Factors: NA  Historical Factors: NA  Risk Reduction Factors:   Sense of responsibility to family, Living with another person, especially a relative, Positive social support and Positive therapeutic relationship  Continued Clinical Symptoms:  None  Cognitive Features That Contribute To Risk:  None    Suicide Risk:  Minimal: No identifiable suicidal ideation.  Patients presenting with no risk factors but with morbid ruminations; may be classified as minimal risk based on the severity of the depressive symptoms    Plan Of Care/Follow-up recommendations:  Activity:  as tolerated Diet:  heart healthy diet  Kimberlyn Quiocho, NP 06/08/2016, 10:39 AM
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
## 2016-10-14 ENCOUNTER — Other Ambulatory Visit (HOSPITAL_BASED_OUTPATIENT_CLINIC_OR_DEPARTMENT_OTHER): Payer: Self-pay

## 2016-10-14 DIAGNOSIS — G4730 Sleep apnea, unspecified: Secondary | ICD-10-CM

## 2016-11-10 ENCOUNTER — Ambulatory Visit (HOSPITAL_BASED_OUTPATIENT_CLINIC_OR_DEPARTMENT_OTHER): Payer: Medicaid Other | Attending: Internal Medicine | Admitting: Internal Medicine

## 2016-11-10 DIAGNOSIS — G4730 Sleep apnea, unspecified: Secondary | ICD-10-CM | POA: Diagnosis present

## 2016-11-10 DIAGNOSIS — G4736 Sleep related hypoventilation in conditions classified elsewhere: Secondary | ICD-10-CM | POA: Insufficient documentation

## 2016-11-10 DIAGNOSIS — G4700 Insomnia, unspecified: Secondary | ICD-10-CM | POA: Insufficient documentation

## 2016-11-14 DIAGNOSIS — G4730 Sleep apnea, unspecified: Secondary | ICD-10-CM

## 2016-11-14 NOTE — Procedures (Signed)
   Patient Name: Ryan Choi, Ryan Choi Date: 11/10/2016 Gender: Male D.O.B: October 24, 1970 Age (years): 45 Referring Provider: Nolene Ebbs Height (inches): 79 Interpreting Physician: Baird Lyons MD, ABSM Weight (lbs): 220 RPSGT: Earney [place] BMI: 46 MRN: [id number] Neck Size: 16.50 CLINICAL INFORMATION Sleep Study Type: NPSG  Indication for sleep study: OSA  Epworth Sleepiness Score: 6  SLEEP STUDY TECHNIQUE As per the AASM Manual for the Scoring of Sleep and Associated Events v2.3 (April 2016) with a hypopnea requiring 4% desaturations.  The channels recorded and monitored were frontal, central and occipital EEG, electrooculogram (EOG), submentalis EMG (chin), nasal and oral airflow, thoracic and abdominal wall motion, anterior tibialis EMG, snore microphone, electrocardiogram, and pulse oximetry.  MEDICATIONS Medications self-administered by patient taken the night of the study : none reported  SLEEP ARCHITECTURE The study was initiated at 7:52:05 PM and ended at 4:51:06 AM.  Sleep onset time was 207.4 minutes and the sleep efficiency was 43.8%. The total sleep time was 236.1 minutes.  Stage REM latency was 45.5 minutes.  The patient spent 2.33% of the night in stage N1 sleep, 62.51% in stage N2 sleep, 0.00% in stage N3 and 35.16% in REM.  Alpha intrusion was absent.  Supine sleep was 40.09%.  RESPIRATORY PARAMETERS The overall apnea/hypopnea index (AHI) was 3.3 per hour. There were 1 total apneas, including 1 obstructive, 0 central and 0 mixed apneas. There were 12 hypopneas and 40 RERAs.  The AHI during Stage REM sleep was 8.7 per hour.  AHI while supine was 8.2 per hour.  The mean oxygen saturation was 92.27%. The minimum SpO2 during sleep was 80.00%.  Soft snoring was noted during this study.  CARDIAC DATA The 2 lead EKG demonstrated sinus rhythm. The mean heart rate was N/A beats per minute. Other EKG findings include: PVCs.  LEG MOVEMENT  DATA The total PLMS were 12 with a resulting PLMS index of 3.05. Associated arousal with leg movement index was 0.0 .  IMPRESSIONS - No significant obstructive sleep apnea occurred during this study (AHI = 3.3/h). - No significant central sleep apnea occurred during this study (CAI = 0.0/h). - Oxygen desaturation was noted during this study (Min O2 = 80.00%, Mean 92.2%). - The patient snored with Soft snoring volume. - EKG findings include PVCs. - Clinically significant periodic limb movements did not occur during sleep. No significant associated arousals. - Patient had significant difficulty initiating and maintaining sleep.  DIAGNOSIS - Nocturnal Hypoxemia (327.26 [G47.36 ICD-10]) - Insomnia  RECOMMENDATIONS - Avoid alcohol, sedatives and other CNS depressants that may worsen sleep apnea and disrupt normal sleep architecture. - Sleep hygiene should be reviewed to assess factors that may improve sleep quality. - Weight management and regular exercise should be initiated or continued if appropriate.  [Electronically signed] 11/14/2016 10:41 AM  Baird Lyons MD, ABSM Diplomate, American Board of Sleep Medicine   NPI: [id number]  [place], American Board of Sleep Medicine  ELECTRONICALLY SIGNED ON:  11/14/2016, 10:41 AM [place] PH: [phone]   FX: [phone] [place]

## 2017-07-18 ENCOUNTER — Encounter (HOSPITAL_COMMUNITY): Payer: Self-pay | Admitting: Emergency Medicine

## 2017-07-18 ENCOUNTER — Emergency Department (HOSPITAL_COMMUNITY)
Admission: EM | Admit: 2017-07-18 | Discharge: 2017-07-18 | Disposition: A | Discharge status: Home / Self Care | Payer: Medicaid Other | Attending: Emergency Medicine | Admitting: Emergency Medicine

## 2017-07-18 DIAGNOSIS — F258 Other schizoaffective disorders: Secondary | ICD-10-CM | POA: Diagnosis not present

## 2017-07-18 DIAGNOSIS — F17210 Nicotine dependence, cigarettes, uncomplicated: Secondary | ICD-10-CM | POA: Diagnosis not present

## 2017-07-18 DIAGNOSIS — I10 Essential (primary) hypertension: Secondary | ICD-10-CM | POA: Insufficient documentation

## 2017-07-18 DIAGNOSIS — Z008 Encounter for other general examination: Secondary | ICD-10-CM | POA: Diagnosis present

## 2017-07-18 DIAGNOSIS — Z79899 Other long term (current) drug therapy: Secondary | ICD-10-CM | POA: Insufficient documentation

## 2017-07-18 DIAGNOSIS — F259 Schizoaffective disorder, unspecified: Secondary | ICD-10-CM

## 2017-07-18 DIAGNOSIS — R440 Auditory hallucinations: Secondary | ICD-10-CM | POA: Diagnosis not present

## 2017-07-18 LAB — CBC WITH DIFFERENTIAL/PLATELET
BASOS ABS: 0 10*3/uL (ref 0.0–0.1)
BASOS PCT: 0 %
EOS ABS: 0.2 10*3/uL (ref 0.0–0.7)
Eosinophils Relative: 2 %
HEMATOCRIT: 48.9 % (ref 39.0–52.0)
Hemoglobin: 16.2 g/dL (ref 13.0–17.0)
Lymphocytes Relative: 18 %
Lymphs Abs: 1.9 10*3/uL (ref 0.7–4.0)
MCH: 27.5 pg (ref 26.0–34.0)
MCHC: 33.1 g/dL (ref 30.0–36.0)
MCV: 83 fL (ref 78.0–100.0)
MONO ABS: 1 10*3/uL (ref 0.1–1.0)
MONOS PCT: 9 %
NEUTROS ABS: 7.6 10*3/uL (ref 1.7–7.7)
NEUTROS PCT: 71 %
Platelets: 307 10*3/uL (ref 150–400)
RBC: 5.89 MIL/uL — ABNORMAL HIGH (ref 4.22–5.81)
RDW: 15.1 % (ref 11.5–15.5)
WBC: 10.6 10*3/uL — ABNORMAL HIGH (ref 4.0–10.5)

## 2017-07-18 LAB — COMPREHENSIVE METABOLIC PANEL
ALBUMIN: 4 g/dL (ref 3.5–5.0)
ALT: 19 U/L (ref 17–63)
ANION GAP: 9 (ref 5–15)
AST: 19 U/L (ref 15–41)
Alkaline Phosphatase: 70 U/L (ref 38–126)
BUN: 13 mg/dL (ref 6–20)
CHLORIDE: 100 mmol/L — AB (ref 101–111)
CO2: 28 mmol/L (ref 22–32)
Calcium: 8.8 mg/dL — ABNORMAL LOW (ref 8.9–10.3)
Creatinine, Ser: 1 mg/dL (ref 0.61–1.24)
GFR calc Af Amer: >60 mL/min (ref >60)
GFR calc non Af Amer: >60 mL/min (ref >60)
GLUCOSE: 112 mg/dL — AB (ref 65–99)
POTASSIUM: 3.3 mmol/L — AB (ref 3.5–5.1)
SODIUM: 137 mmol/L (ref 135–145)
Total Bilirubin: 0.4 mg/dL (ref 0.3–1.2)
Total Protein: 7.3 g/dL (ref 6.5–8.1)

## 2017-07-18 LAB — RAPID URINE DRUG SCREEN, HOSP PERFORMED
AMPHETAMINES: NOT DETECTED
BARBITURATES: NOT DETECTED
BENZODIAZEPINES: NOT DETECTED
COCAINE: POSITIVE — AB
Opiates: NOT DETECTED
Tetrahydrocannabinol: NOT DETECTED

## 2017-07-18 LAB — ETHANOL: Alcohol, Ethyl (B): <10 mg/dL (ref <10)

## 2017-07-18 NOTE — ED Notes (Signed)
May return to [facility] once Medically cleared

## 2017-07-18 NOTE — ED Notes (Signed)
Bed: [facility]4 Expected date:  Expected time:  Means of arrival:  Comments: 

## 2017-07-18 NOTE — ED Provider Notes (Signed)
[facility] DEPT Provider Note   CSN: [id number] Arrival date & time: 07/18/17  1603     History   Chief Complaint Chief Complaint  Patient presents with  . Medical Clearance    HPI Ryan Choi is a 46 y.o. male.  HPI Patient is a history of schizoaffective disorder.  He presents to the emergency department from [facility] where he was being seen for auditory hallucinations.  He was sent to the emergency department for laboratory studies only.  They will take him back to [facility] after his labs return.  No homicidal or suicidal thoughts.  Patient has been using cocaine.   Past Medical History:  Diagnosis Date  . Hypercholesteremia   . Hypertension   . Schizo affective schizophrenia [facility])     Patient Active Problem List   Diagnosis Date Noted  . Hyperprolactinemia ([place]) 04/26/2015  . HTN (hypertension) 04/24/2015  . Hyperlipidemia 04/24/2015  . Cocaine use disorder, moderate, dependence ([place]) 04/24/2015  . Alcohol use disorder, moderate, dependence ([place]) 04/24/2015  . Cannabis use disorder, severe, dependence ([place]) 04/24/2015  . Tobacco use disorder 04/24/2015  . Schizoaffective disorder, bipolar type ([place]) 04/23/2015  . ERECTILE DYSFUNCTION 06/05/2007    Past Surgical History:  Procedure Laterality Date  . CHOLECYSTECTOMY         Home Medications    Prior to Admission medications   Medication Sig Start Date End Date Taking? Authorizing Provider  albuterol (PROVENTIL HFA;VENTOLIN HFA) 108 (90 BASE) MCG/ACT inhaler Inhale 1-2 puffs into the lungs every 6 (six) hours as needed for wheezing or shortness of breath. Patient not taking: Reported on 06/07/2016 02/12/15   Leo Grosser, MD  ALPRAZolam Duanne Moron) 0.5 MG tablet Take 0.5 mg by mouth at bedtime. 05/26/16   [provider]  amantadine (SYMMETREL) 100 MG capsule Take 1 capsule (100 mg total) by mouth 2 (two) times daily. 06/08/16   Patrecia Pour, NP  carbamazepine  (TEGRETOL XR) 200 MG 12 hr tablet Take 1 tablet (200 mg total) by mouth 2 (two) times daily. 06/08/16   Patrecia Pour, NP  fluPHENAZine (PROLIXIN) 10 MG tablet Take 1 tablet (10 mg total) by mouth every evening. 06/08/16   Patrecia Pour, NP  hydrochlorothiazide (HYDRODIURIL) 25 MG tablet Take 1 tablet (25 mg total) by mouth daily. 06/08/16   Patrecia Pour, NP  mirtazapine (REMERON) 15 MG tablet Take 1 tablet (15 mg total) by mouth at bedtime. 06/08/16   Patrecia Pour, NP  nicotine (NICODERM CQ - DOSED IN MG/24 HOURS) 21 mg/24hr patch Place 1 patch (21 mg total) onto the skin daily. Patient not taking: Reported on 04/08/2016 04/29/15   Niel Hummer, NP  risperidone (RISPERDAL) 4 MG tablet Take 1 tablet (4 mg total) by mouth 2 (two) times daily. 06/08/16   Patrecia Pour, NP  risperiDONE microspheres (RISPERDAL CONSTA) 50 MG injection Inject 50 mg into the muscle every 14 (fourteen) days.    [provider]  traZODone (DESYREL) 100 MG tablet Take 1 tablet (100 mg total) by mouth at bedtime as needed for sleep. 06/08/16   Patrecia Pour, NP    Family History Family History  Problem Relation Age of Onset  . Hypertension Mother   . Diabetes Mother   . Mental illness Neg Hx     Social History Social History   Tobacco Use  . Smoking status: Current Every Day Smoker    Packs/day: 0.50    Types: Cigarettes  .  Smokeless tobacco: Never Used  Substance Use Topics  . Alcohol use: Yes    Alcohol/week: 1.2 oz    Types: 2 Cans of beer per week  . Drug use: Yes    Types: Cocaine, Marijuana     Allergies   Ibuprofen; Omeprazole; and Tylenol [acetaminophen]   Review of Systems Review of Systems  All other systems reviewed and are negative.    Physical Exam Updated Vital Signs BP (!) 149/97 (BP Location: Left Arm)   Pulse 84   Temp 98.7 F (37.1 C) (Oral)   Resp 18   SpO2 98%   Physical Exam  Constitutional: He is oriented to person, place, and time. He appears  well-developed and well-nourished.  HENT:  Head: Normocephalic and atraumatic.  Eyes: EOM are normal.  Neck: Normal range of motion.  Cardiovascular: Normal rate and regular rhythm.  Pulmonary/Chest: Effort normal and breath sounds normal. No respiratory distress.  Abdominal: Soft. He exhibits no distension. There is no tenderness.  Musculoskeletal: Normal range of motion.  Neurological: He is alert and oriented to person, place, and time.  Skin: Skin is warm and dry.  Psychiatric: He has a normal mood and affect. Judgment normal.  Nursing note and vitals reviewed.    ED Treatments / Results  Labs (all labs ordered are listed, but only abnormal results are displayed) Labs Reviewed  COMPREHENSIVE METABOLIC PANEL - Abnormal; Notable for the following components:      Result Value   Potassium 3.3 (*)    Chloride 100 (*)    Glucose, Bld 112 (*)    Calcium 8.8 (*)    All other components within normal limits  RAPID URINE DRUG SCREEN, HOSP PERFORMED - Abnormal; Notable for the following components:   Cocaine POSITIVE (*)    All other components within normal limits  CBC WITH DIFFERENTIAL/PLATELET - Abnormal; Notable for the following components:   WBC 10.6 (*)    RBC 5.89 (*)    All other components within normal limits  ETHANOL    EKG  EKG Interpretation None       Radiology No results found.  Procedures Procedures (including critical care time)  Medications Ordered in ED Medications - No data to display   Initial Impression / Assessment and Plan / ED Course  I have reviewed the triage vital signs and the nursing notes.  Pertinent labs & imaging results that were available during my care of the patient were reviewed by me and considered in my medical decision making (see chart for details).     Calm and cooperative in the emergency department.  Medically clear.  Labs without abnormality.  Discharge back to [facility].  Final Clinical Impressions(s) / ED  Diagnoses   Final diagnoses:  None    ED Discharge Orders    None       Jola Schmidt, MD 07/18/17 1747

## 2017-07-18 NOTE — ED Triage Notes (Signed)
Per Beverly Sessions, patient just needs Medical clearance and can return to [facility] after he is cleared-states he has been sleeping in car in parking deck-using cocaine-no SI/HI

## 2017-07-18 NOTE — ED Notes (Signed)
Informed [facility] patient was medically cleared and he needed to be transported back-stated they will call a cab for him-paperwork given to patient-patient aware of plan

## 2017-07-18 NOTE — Discharge Instructions (Signed)
You are to return to [facility] at this time

## 2017-08-18 ENCOUNTER — Encounter (HOSPITAL_BASED_OUTPATIENT_CLINIC_OR_DEPARTMENT_OTHER): Payer: Self-pay | Admitting: *Deleted

## 2017-08-18 ENCOUNTER — Emergency Department (HOSPITAL_BASED_OUTPATIENT_CLINIC_OR_DEPARTMENT_OTHER)
Admission: EM | Admit: 2017-08-18 | Discharge: 2017-08-18 | Disposition: A | Discharge status: Home / Self Care | Payer: Medicaid Other | Attending: Emergency Medicine | Admitting: Emergency Medicine

## 2017-08-18 ENCOUNTER — Other Ambulatory Visit: Payer: Self-pay

## 2017-08-18 DIAGNOSIS — Z7982 Long term (current) use of aspirin: Secondary | ICD-10-CM | POA: Insufficient documentation

## 2017-08-18 DIAGNOSIS — Z79899 Other long term (current) drug therapy: Secondary | ICD-10-CM | POA: Insufficient documentation

## 2017-08-18 DIAGNOSIS — F17210 Nicotine dependence, cigarettes, uncomplicated: Secondary | ICD-10-CM | POA: Diagnosis not present

## 2017-08-18 DIAGNOSIS — Z9049 Acquired absence of other specified parts of digestive tract: Secondary | ICD-10-CM | POA: Insufficient documentation

## 2017-08-18 DIAGNOSIS — F1420 Cocaine dependence, uncomplicated: Secondary | ICD-10-CM | POA: Diagnosis not present

## 2017-08-18 DIAGNOSIS — R0981 Nasal congestion: Secondary | ICD-10-CM

## 2017-08-18 DIAGNOSIS — F1220 Cannabis dependence, uncomplicated: Secondary | ICD-10-CM | POA: Diagnosis not present

## 2017-08-18 DIAGNOSIS — F209 Schizophrenia, unspecified: Secondary | ICD-10-CM | POA: Diagnosis not present

## 2017-08-18 DIAGNOSIS — I10 Essential (primary) hypertension: Secondary | ICD-10-CM | POA: Diagnosis not present

## 2017-08-18 LAB — RAPID STREP SCREEN (MED CTR MEBANE ONLY): Streptococcus, Group A Screen (Direct): NEGATIVE

## 2017-08-18 MED ORDER — CETIRIZINE HCL 10 MG PO TABS: 10.0000 mg | ORAL_TABLET | Freq: Every day | ORAL | 1 refills | Status: AC

## 2017-08-18 NOTE — ED Triage Notes (Signed)
He comes from [facility]. Here for difficulty breathing at night since he has been off crack cocaine in the past 35 days. He has gained weight and now can't breathe at night. His mouth gets dry and sometimes bleeds.

## 2017-08-18 NOTE — ED Provider Notes (Signed)
[facility] EMERGENCY DEPARTMENT Provider Note   CSN: [id number] Arrival date & time: 08/18/17  1824     History   Chief Complaint Chief Complaint  Patient presents with  . Nasal Congestion    HPI Ryan Choi is a 46 y.o. male.  HPI  46 year old male with a history of cocaine abuse presents with nasal congestion.  He states he has chronically had nasal congestion for years.  However he usually takes Zyrtec but when he got into [facility] they stopped giving him Zyrtec.  Now his congestion is getting worse.  It is causing him to have to breathe through his mouth when he sleeps.  Over the last 2 weeks or so he wakes up with blood in his mouth that seems to be coming from his throat.  He thinks is because his mouth is getting dry.  There is no other bleeding besides in the middle the night/early morning.  He does not have gingival bleeding or other spontaneous bleeding.  He currently has no pain or sore throat.  No fevers.  He states he has on and off shortness of breath from asthma but currently is not short of breath.  He states he needs a prescription for Zyrtec to take back to [facility].  He also states that since stopping cocaine he has been gaining weight as originally when he did cocaine he lost weight.  Used to be on CPAP but has not been on it for years.  He was told by the [place] physician he does not need CPAP.  Past Medical History:  Diagnosis Date  . Hypercholesteremia   . Hypertension   . Schizo affective schizophrenia [facility])     Patient Active Problem List   Diagnosis Date Noted  . Hyperprolactinemia ([place]) 04/26/2015  . HTN (hypertension) 04/24/2015  . Hyperlipidemia 04/24/2015  . Cocaine use disorder, moderate, dependence ([place]) 04/24/2015  . Alcohol use disorder, moderate, dependence ([place]) 04/24/2015  . Cannabis use disorder, severe, dependence ([place]) 04/24/2015  . Tobacco use disorder 04/24/2015  . Schizoaffective disorder, bipolar type ([place]) 04/23/2015   . ERECTILE DYSFUNCTION 06/05/2007    Past Surgical History:  Procedure Laterality Date  . CHOLECYSTECTOMY         Home Medications    Prior to Admission medications   Medication Sig Start Date End Date Taking? Authorizing Provider  albuterol (PROVENTIL HFA;VENTOLIN HFA) 108 (90 BASE) MCG/ACT inhaler Inhale 1-2 puffs into the lungs every 6 (six) hours as needed for wheezing or shortness of breath. 02/12/15  Yes Leo Grosser, MD  amLODipine (NORVASC) 5 MG tablet Take 5 mg by mouth daily.   Yes [provider]  Aspirin (ASPIR-81 PO) Take by mouth.   Yes [provider]  busPIRone (BUSPAR) 15 MG tablet Take 15 mg by mouth 3 (three) times daily.   Yes [provider]  carbamazepine (TEGRETOL XR) 200 MG 12 hr tablet Take 1 tablet (200 mg total) by mouth 2 (two) times daily. 06/08/16  Yes Patrecia Pour, NP  fluPHENAZine (PROLIXIN) 10 MG tablet Take 1 tablet (10 mg total) by mouth every evening. 06/08/16  Yes Patrecia Pour, NP  GABAPENTIN PO Take by mouth.   Yes [provider]  hydrochlorothiazide (HYDRODIURIL) 25 MG tablet Take 1 tablet (25 mg total) by mouth daily. 06/08/16  Yes Patrecia Pour, NP  risperidone (RISPERDAL) 4 MG tablet Take 1 tablet (4 mg total) by mouth 2 (two) times daily. 06/08/16  Yes Patrecia Pour,  NP  traZODone (DESYREL) 100 MG tablet Take 1 tablet (100 mg total) by mouth at bedtime as needed for sleep. 06/08/16  Yes Patrecia Pour, NP  amantadine (SYMMETREL) 100 MG capsule Take 1 capsule (100 mg total) by mouth 2 (two) times daily. 06/08/16   Patrecia Pour, NP  cetirizine (ZYRTEC ALLERGY) 10 MG tablet Take 1 tablet (10 mg total) by mouth daily. 08/18/17   Sherwood Gambler, MD  mirtazapine (REMERON) 15 MG tablet Take 1 tablet (15 mg total) by mouth at bedtime. 06/08/16   Patrecia Pour, NP  nicotine (NICODERM CQ - DOSED IN MG/24 HOURS) 21 mg/24hr patch Place 1 patch (21 mg total) onto the skin daily. Patient not taking: Reported on  04/08/2016 04/29/15   Niel Hummer, NP  risperiDONE microspheres (RISPERDAL CONSTA) 50 MG injection Inject 50 mg into the muscle every 14 (fourteen) days.    [provider]    Family History Family History  Problem Relation Age of Onset  . Hypertension Mother   . Diabetes Mother   . Mental illness Neg Hx     Social History Social History   Tobacco Use  . Smoking status: Current Every Day Smoker    Packs/day: 0.50    Types: Cigarettes  . Smokeless tobacco: Never Used  Substance Use Topics  . Alcohol use: Yes    Alcohol/week: 1.2 oz    Types: 2 Cans of beer per week  . Drug use: Yes    Types: Cocaine, Marijuana     Allergies   Ibuprofen; Omeprazole; and Tylenol [acetaminophen]   Review of Systems Review of Systems  Constitutional: Negative for fever.  HENT: Positive for congestion. Negative for sore throat.   Respiratory: Positive for shortness of breath (on and off, none now). Negative for cough.   Cardiovascular: Negative for chest pain and leg swelling.  All other systems reviewed and are negative.    Physical Exam Updated Vital Signs BP (!) 154/97   Pulse 99   Temp 98.1 F (36.7 C) (Oral)   Resp 18   Ht 5\' 8"  (1.727 m)   Wt 114.4 kg (252 lb 3.3 oz)   SpO2 96%   BMI 38.35 kg/m   Physical Exam  Constitutional: He is oriented to person, place, and time. He appears well-developed and well-nourished.  HENT:  Head: Normocephalic and atraumatic.  Right Ear: External ear normal.  Left Ear: External ear normal.  Nose: Nose normal.  Mouth/Throat: Oropharynx is clear and moist. No oropharyngeal exudate.  Bilateral nasal congestion as well as swollen turbinates bilaterally  Eyes: Right eye exhibits no discharge. Left eye exhibits no discharge.  Neck: Neck supple.  Cardiovascular: Normal rate, regular rhythm and normal heart sounds.  Pulmonary/Chest: Effort normal and breath sounds normal. No stridor. He has no wheezes. He has no rales.  Abdominal:  Soft. There is no tenderness.  Musculoskeletal: He exhibits no edema.  Neurological: He is alert and oriented to person, place, and time.  Skin: Skin is warm and dry.  Nursing note and vitals reviewed.    ED Treatments / Results  Labs (all labs ordered are listed, but only abnormal results are displayed) Labs Reviewed  RAPID STREP SCREEN (NOT AT [facility]  CULTURE, GROUP A STREP [facility])    EKG  EKG Interpretation None       Radiology No results found.  Procedures Procedures (including critical care time)  Medications Ordered in ED Medications - No data to display   Initial Impression /  Assessment and Plan / ED Course  I have reviewed the triage vital signs and the nursing notes.  Pertinent labs & imaging results that were available during my care of the patient were reviewed by me and considered in my medical decision making (see chart for details).     Patient's bleeding when he first wakes up is likely from breathing through his mouth/dry oropharynx.  I will prescribe him Zyrtec as this has helped him in the past.  He states he has had on and off shortness of breath that he attributes to asthma.  Offered chest x-ray but he declines.  He overall appears quite well.  He did not appear in any distress.  He will need to follow-up with a PCP for outpatient workup of whether or not he has sleep apnea and needs CPAP.  Also discussed he can use a humidifier to see if this helps.  Otherwise, hopefully Zyrtec will help his congestion which will likely help with the dry mouth in the morning.  Discharge with return precautions.  Final Clinical Impressions(s) / ED Diagnoses   Final diagnoses:  Nasal congestion    ED Discharge Orders        Ordered    cetirizine (ZYRTEC ALLERGY) 10 MG tablet  Daily     08/18/17 1954       Sherwood Gambler, MD 08/18/17 2027

## 2017-08-18 NOTE — ED Notes (Signed)
Pt verbalizes understanding of dc instructions and denies any further needs at this time.  [facility] called and they will pick up from the waiting room

## 2017-08-21 LAB — CULTURE, GROUP A STREP (THRC)

## 2017-12-23 ENCOUNTER — Emergency Department (HOSPITAL_COMMUNITY)
Admission: EM | Admit: 2017-12-23 | Discharge: 2017-12-24 | Disposition: A | Discharge status: Home / Self Care | Payer: Medicaid Other | Attending: Emergency Medicine | Admitting: Emergency Medicine

## 2017-12-23 ENCOUNTER — Encounter (HOSPITAL_COMMUNITY): Payer: Self-pay | Admitting: Emergency Medicine

## 2017-12-23 ENCOUNTER — Emergency Department (HOSPITAL_COMMUNITY): Payer: Medicaid Other

## 2017-12-23 DIAGNOSIS — F17210 Nicotine dependence, cigarettes, uncomplicated: Secondary | ICD-10-CM | POA: Insufficient documentation

## 2017-12-23 DIAGNOSIS — Z79899 Other long term (current) drug therapy: Secondary | ICD-10-CM | POA: Insufficient documentation

## 2017-12-23 DIAGNOSIS — R0789 Other chest pain: Secondary | ICD-10-CM | POA: Diagnosis not present

## 2017-12-23 DIAGNOSIS — Z7982 Long term (current) use of aspirin: Secondary | ICD-10-CM | POA: Insufficient documentation

## 2017-12-23 DIAGNOSIS — I10 Essential (primary) hypertension: Secondary | ICD-10-CM | POA: Diagnosis not present

## 2017-12-23 DIAGNOSIS — R079 Chest pain, unspecified: Secondary | ICD-10-CM | POA: Diagnosis present

## 2017-12-23 LAB — BASIC METABOLIC PANEL
ANION GAP: 11 (ref 5–15)
BUN: 13 mg/dL (ref 6–20)
CO2: 26 mmol/L (ref 22–32)
Calcium: 9.2 mg/dL (ref 8.9–10.3)
Chloride: 105 mmol/L (ref 101–111)
Creatinine, Ser: 1.03 mg/dL (ref 0.61–1.24)
GFR calc non Af Amer: >60 mL/min (ref >60)
GLUCOSE: 116 mg/dL — AB (ref 65–99)
POTASSIUM: 3.2 mmol/L — AB (ref 3.5–5.1)
Sodium: 142 mmol/L (ref 135–145)

## 2017-12-23 LAB — CBC
HEMATOCRIT: 45.6 % (ref 39.0–52.0)
HEMOGLOBIN: 14.9 g/dL (ref 13.0–17.0)
MCH: 26.7 pg (ref 26.0–34.0)
MCHC: 32.7 g/dL (ref 30.0–36.0)
MCV: 81.6 fL (ref 78.0–100.0)
Platelets: 395 10*3/uL (ref 150–400)
RBC: 5.59 MIL/uL (ref 4.22–5.81)
RDW: 14.6 % (ref 11.5–15.5)
WBC: 13.6 10*3/uL — ABNORMAL HIGH (ref 4.0–10.5)

## 2017-12-23 LAB — I-STAT TROPONIN, ED
TROPONIN I, POC: 0 ng/mL (ref 0.00–0.08)
TROPONIN I, POC: 0 ng/mL (ref 0.00–0.08)

## 2017-12-23 LAB — RAPID URINE DRUG SCREEN, HOSP PERFORMED
AMPHETAMINES: NOT DETECTED
BENZODIAZEPINES: NOT DETECTED
Barbiturates: NOT DETECTED
COCAINE: NOT DETECTED
Opiates: NOT DETECTED
Tetrahydrocannabinol: NOT DETECTED

## 2017-12-23 LAB — URINALYSIS, ROUTINE W REFLEX MICROSCOPIC
Bilirubin Urine: NEGATIVE
GLUCOSE, UA: NEGATIVE mg/dL
Hgb urine dipstick: NEGATIVE
KETONES UR: NEGATIVE mg/dL
LEUKOCYTES UA: NEGATIVE
Nitrite: NEGATIVE
PH: 6 (ref 5.0–8.0)
PROTEIN: NEGATIVE mg/dL
Specific Gravity, Urine: 1.01 (ref 1.005–1.030)

## 2017-12-23 LAB — D-DIMER, QUANTITATIVE: D-Dimer, Quant: 0.39 ug/mL-FEU (ref 0.00–0.50)

## 2017-12-23 MED ORDER — ASPIRIN EC 81 MG PO TBEC
243.0000 mg | DELAYED_RELEASE_TABLET | Freq: Once | ORAL | Status: AC
  Administered 2017-12-23: 243 mg via ORAL
  Filled 2017-12-23: qty 3

## 2017-12-23 MED ORDER — SODIUM CHLORIDE 0.9 % IV BOLUS
1000.0000 mL | Freq: Once | INTRAVENOUS | Status: AC
  Administered 2017-12-23: 1000 mL via INTRAVENOUS

## 2017-12-23 MED ORDER — POTASSIUM CHLORIDE CRYS ER 20 MEQ PO TBCR
40.0000 meq | EXTENDED_RELEASE_TABLET | Freq: Once | ORAL | Status: AC
  Administered 2017-12-23: 40 meq via ORAL
  Filled 2017-12-23: qty 2

## 2017-12-23 NOTE — ED Notes (Signed)
Drinks about 12-15 energy drinks per day, as well as 2 2liter of Pepsi per day and smokes 2 2 1/2 packs newports per day

## 2017-12-23 NOTE — ED Provider Notes (Signed)
[facility] DEPT Provider Note   CSN: [id number] Arrival date & time: 12/23/17  1753     History   Chief Complaint Chief Complaint  Patient presents with  . Chest Pain  . headache    HPI Ryan Choi is a 47 y.o. male.  HPI   Pt is a 47 year old male with a history of schizoaffective disorder, HLD, hypertension, cocaine use disorder, alcohol use disorder, tobacco use disorder who presents the ED today complaining of left-sided chest pains that began around 3 AM this morning.  States the pain is constant and radiates down the left arm. Has paresthesias to the LUE (chronic, intermittent, resolved with gabapentin). Rates chest pain 8/10.  States he has chronic shortness of breath which is not worse today.  Denies associated diaphoresis.  He is also reporting that he has had a headache for the last several days which was gradual in onset.  HA is frontal and he rates it at 8/10.  It Is not the worst headache of his life.  He is reporting associated photophobia, but denies any vision changes. He also reports nausea and mild lower abdominal pain that has been ongoing for several days.  Also complaining of lightheadedness that occurs when he sits up or stands up.  Also endorsing an episode of dizziness that occurred in the waiting room with room spinning.  Denies current symptoms of that now.  Does not have dizziness when he turns his head from the left to the right.  No hearing changes.  No numbness or weakness to his arms or legs.  Of note patient states that his mother died earlier this week due to cardiac arrest.  States she died 3 days ago and he has had a lot of stress and then.  He states he has not eaten anything or slept in about 4 days.  He denies SI or HI.  Patient is endorsing hallucinations and states that he was seeing dead people earlier today however he is not having any hallucinations currently.  Not having any auditory or command hallucinations.   Has had a history of visual hallucinations.  States that he has been taking all of his medications as prescribed.  States he lives at [facility].  Has history of tobacco use and used to smoke 2.5 packs/day since 2009 however he quit 3 days ago after his mother died.  Also used to drink 12-15 energy drinks per day however he also quit doing this as well several days ago.  Past Medical History:  Diagnosis Date  . Hypercholesteremia   . Hypertension   . Schizo affective schizophrenia [facility])     Patient Active Problem List   Diagnosis Date Noted  . Hyperprolactinemia ([place]) 04/26/2015  . HTN (hypertension) 04/24/2015  . Hyperlipidemia 04/24/2015  . Cocaine use disorder, moderate, dependence ([place]) 04/24/2015  . Alcohol use disorder, moderate, dependence ([place]) 04/24/2015  . Cannabis use disorder, severe, dependence ([place]) 04/24/2015  . Tobacco use disorder 04/24/2015  . Schizoaffective disorder, bipolar type ([place]) 04/23/2015  . ERECTILE DYSFUNCTION 06/05/2007    Past Surgical History:  Procedure Laterality Date  . CHOLECYSTECTOMY          Home Medications    Prior to Admission medications   Medication Sig Start Date End Date Taking? Authorizing Provider  amLODipine (NORVASC) 10 MG tablet Take 10 mg by mouth daily.   Yes [provider]  aspirin EC 81 MG tablet Take 81 mg by mouth daily.   Yes  [provider]  cetirizine (ZYRTEC ALLERGY) 10 MG tablet Take 1 tablet (10 mg total) by mouth daily. 08/18/17  Yes Sherwood Gambler, MD  gabapentin (NEURONTIN) 300 MG capsule Take 300 mg by mouth 3 (three) times daily.   Yes [provider]  hydrochlorothiazide (HYDRODIURIL) 25 MG tablet Take 1 tablet (25 mg total) by mouth daily. 06/08/16  Yes Patrecia Pour, NP  Melatonin 3 MG TABS Take 1 tablet by mouth daily.   Yes [provider]  risperiDONE ER (PERSERIS) 120 MG PRSY Inject 120 mg into the skin every 30 (thirty) days.   Yes [provider]    traZODone (DESYREL) 100 MG tablet Take 1 tablet (100 mg total) by mouth at bedtime as needed for sleep. Patient taking differently: Take 100 mg by mouth at bedtime.  06/08/16  Yes Lord, Asa Saunas, NP  albuterol (PROVENTIL HFA;VENTOLIN HFA) 108 (90 BASE) MCG/ACT inhaler Inhale 1-2 puffs into the lungs every 6 (six) hours as needed for wheezing or shortness of breath. Patient not taking: Reported on 12/23/2017 02/12/15   Leo Grosser, MD  amantadine (SYMMETREL) 100 MG capsule Take 1 capsule (100 mg total) by mouth 2 (two) times daily. Patient not taking: Reported on 12/23/2017 06/08/16   Patrecia Pour, NP  carbamazepine (TEGRETOL XR) 200 MG 12 hr tablet Take 1 tablet (200 mg total) by mouth 2 (two) times daily. Patient not taking: Reported on 12/23/2017 06/08/16   Patrecia Pour, NP  fluPHENAZine (PROLIXIN) 10 MG tablet Take 1 tablet (10 mg total) by mouth every evening. Patient not taking: Reported on 12/23/2017 06/08/16   Patrecia Pour, NP  mirtazapine (REMERON) 15 MG tablet Take 1 tablet (15 mg total) by mouth at bedtime. Patient not taking: Reported on 12/23/2017 06/08/16   Patrecia Pour, NP  nicotine (NICODERM CQ - DOSED IN MG/24 HOURS) 21 mg/24hr patch Place 1 patch (21 mg total) onto the skin daily. Patient not taking: Reported on 04/08/2016 04/29/15   Niel Hummer, NP  risperidone (RISPERDAL) 4 MG tablet Take 1 tablet (4 mg total) by mouth 2 (two) times daily. Patient not taking: Reported on 12/23/2017 06/08/16   Patrecia Pour, NP    Family History Family History  Problem Relation Age of Onset  . Hypertension Mother   . Diabetes Mother   . Mental illness Neg Hx     Social History Social History   Tobacco Use  . Smoking status: Current Every Day Smoker    Packs/day: 0.50    Types: Cigarettes  . Smokeless tobacco: Never Used  Substance Use Topics  . Alcohol use: Yes    Alcohol/week: 1.2 oz    Types: 2 Cans of beer per week  . Drug use: Yes    Types: Cocaine, Marijuana      Allergies   Ibuprofen; Omeprazole; and Tylenol [acetaminophen]   Review of Systems Review of Systems  Constitutional: Negative for chills and fever.  HENT: Negative for sore throat.   Eyes: Positive for photophobia. Negative for visual disturbance.  Respiratory: Positive for shortness of breath (chronic, unchanged). Negative for cough.   Cardiovascular: Positive for chest pain.  Gastrointestinal: Positive for abdominal pain and nausea. Negative for blood in stool, constipation, diarrhea and vomiting.  Genitourinary: Negative for dysuria and frequency.  Musculoskeletal:       Chronic neck pain  Skin: Negative for wound.  Neurological: Positive for dizziness (resolved), light-headedness and headaches. Negative for weakness and numbness.     Physical  Exam Updated Vital Signs BP 140/85 (BP Location: Left Arm)   Pulse 92   Temp 98.4 F (36.9 C) (Oral)   Resp 20   Ht 5\' 8"  (1.727 m)   Wt 106.1 kg (234 lb)   SpO2 98%   BMI 35.58 kg/m   Physical Exam  Constitutional: He is oriented to person, place, and time. He appears well-developed and well-nourished.  Nontoxic-appearing, no acute distress.  He is joking with me on exam.  HENT:  Head: Normocephalic and atraumatic.  Eyes: Pupils are equal, round, and reactive to light. Conjunctivae and EOM are normal.  No vertical or horizontal nystagmus  Neck: Normal range of motion. Neck supple. No JVD present.  No carotid bruit bilaterally.  Tenderness to palpation to the bilateral trapezius muscles.  Cardiovascular: Regular rhythm and normal pulses. Tachycardia present.  No murmur heard. Pulses:      Radial pulses are 2+ on the right side, and 2+ on the left side.       Dorsalis pedis pulses are 2+ on the right side, and 2+ on the left side.  Pulmonary/Chest: Effort normal and breath sounds normal. No respiratory distress. He has no decreased breath sounds. He has no wheezes. He has no rhonchi. He has no rales.  Abdominal: Soft.  There is no tenderness.  Musculoskeletal: He exhibits no edema.       Right lower leg: Normal. He exhibits no tenderness and no edema.       Left lower leg: Normal. He exhibits no tenderness and no edema.  Neurological: He is alert and oriented to person, place, and time.  Mental Status:  Alert, thought content appropriate, able to give a coherent history. Speech fluent without evidence of aphasia. Able to follow 2 step commands without difficulty.  Cranial Nerves:  II:  Peripheral visual fields grossly normal, pupils equal, round, reactive to light III,IV, VI: ptosis not present, extra-ocular motions intact bilaterally  V,VII: smile symmetric, facial light touch sensation equal VIII: hearing grossly normal to voice  X: uvula elevates symmetrically  XI: bilateral shoulder shrug symmetric and strong XII: midline tongue extension without fassiculations Motor:  Normal tone. 5/5 strength of BUE and BLE major muscle groups including strong and equal grip strength and dorsiflexion/plantar flexion Sensory: light touch normal in all extremities. Cerebellar: normal finger-to-nose with bilateral upper extremities Gait: normal gait and balance. CV: 2+ radial and DP/PT pulses  Skin: Skin is warm and dry. Capillary refill takes less than 2 seconds.  Psychiatric: He has a normal mood and affect.  Denies SI/HI  Nursing note and vitals reviewed.   ED Treatments / Results  Labs (all labs ordered are listed, but only abnormal results are displayed) Labs Reviewed  BASIC METABOLIC PANEL - Abnormal; Notable for the following components:      Result Value   Potassium 3.2 (*)    Glucose, Bld 116 (*)    All other components within normal limits  CBC - Abnormal; Notable for the following components:   WBC 13.6 (*)    All other components within normal limits  URINALYSIS, ROUTINE W REFLEX MICROSCOPIC  RAPID URINE DRUG SCREEN, HOSP PERFORMED  D-DIMER, QUANTITATIVE (NOT AT [facility])  I-STAT TROPONIN, ED   I-STAT TROPONIN, ED    EKG EKG Interpretation  Date/Time:  Friday December 23 2017 21:41:37 EDT Ventricular Rate:  92 PR Interval:    QRS Duration: 84 QT Interval:  361 QTC Calculation: 447 R Axis:   86 Text Interpretation:  Sinus rhythm No  significant change since last tracing Confirmed by Dorie Rank [phone]) on 12/23/2017 9:47:38 PM   Radiology Dg Chest 2 View  Result Date: 12/23/2017 CLINICAL DATA:  Chest pain EXAM: CHEST - 2 VIEW COMPARISON:  Chest radiograph 02/12/2015 FINDINGS: The heart size and mediastinal contours are within normal limits. Both lungs are clear. The visualized skeletal structures are unremarkable. IMPRESSION: No active cardiopulmonary disease. Electronically Signed   By: Ulyses Jarred M.D.   On: 12/23/2017 18:54    Procedures Procedures (including critical care time)  Medications Ordered in ED Medications  sodium chloride 0.9 % bolus 1,000 mL (0 mLs Intravenous Stopped 12/23/17 2206)  potassium chloride SA (K-DUR,KLOR-CON) CR tablet 40 mEq (40 mEq Oral Given 12/23/17 2124)  aspirin EC tablet 243 mg (243 mg Oral Given 12/23/17 2206)  sodium chloride 0.9 % bolus 1,000 mL (0 mLs Intravenous Stopped 12/24/17 0033)     Initial Impression / Assessment and Plan / ED Course  I have reviewed the triage vital signs and the nursing notes.  Pertinent labs & imaging results that were available during my care of the patient were reviewed by me and considered in my medical decision making (see chart for details).  Discussed pt presentation, exam findings, and results of workup with Dr. Tomi Bamberger, who agrees with the plan to discharge.  10:58 PM reevaluated patient.  States that his chest pain is completely resolved.  He he also states that his headache has improved.  Ice to the patient up and he no longer feels lightheaded or dizzy when he stands up.  Denies any feelings of room spinning at rest either.  States he feels much improved after 1 L fluid bolus.  He is still  slightly tachycardic pulse of 100-105 in the room.  Will order second fluid bolus and order d-dimer as well.  11:50 PM reevaluated patient.  He continues to deny any chest pain.  Denies any shortness of breath as well.  Denies any dizziness or headaches.  States he feels completely back to normal after receiving fluids.  He continues to deny SI or HI.  He is denying any AVH at this time.  Final Clinical Impressions(s) / ED Diagnoses   Final diagnoses:  Atypical chest pain   47 year old male presenting with multiple complaints including chest pain, headache, lightheadedness and dizziness after not eating and drinking for 4 days after his mother passed away this week.  Patient initially tachycardic and hypertensive.  Normal O2 sats and respirations.  Afebrile.  Cardiopulmonary exam within normal limits.  No tracheal deviation, no JVD or no murmur.  Breath sounds equal bilaterally.  His cardiac work was negative with 2- delta tropes.  Chest x-ray negative for pneumonia, pneumothorax, widened mediastinum.  2 ECGs with normal sinus rhythm and no ischemic changes to either limb.  No arrhythmias to either ECG.  White blood cell count slightly elevated to 13.6 however this appears to be baseline for patient.  Potassium slightly low to 3.2 and was supplemented with 40 equivalents of K-Dur.  UDS was negative.  His symptoms as they are atypical for ACS and his heart score is 3.  He had a negative d-dimer, I do not suspect PE or DVT.  Do not suspect AAA or esophageal perforation given a widened mediastinum and no pneumo mediastinum on chest x-ray.  Do not suspect carotid artery dissection given no carotid bruits, no neurologic findings on exam, and all symptoms completely resolved after fluids and aspirin.  Discussed findings with patient and gave him  strict return precautions.  Advised him to follow-up with his PCP about his symptoms today within the next week.  Advised him to follow-up with his ACT team and  gave him resources for outpatient counseling given the recent loss of his mother.  Advised to return to the ED for any SI, HI, AVH.  He continues to deny symptoms currently.  He agrees to follow-up with his activity tomorrow  ED Discharge Orders    None       Bishop Dublin 12/24/17 0053    Dorie Rank, MD 12/24/17 [phone]

## 2017-12-23 NOTE — ED Triage Notes (Signed)
Pt reports that his mother died 2 days ago and today around 3am started having chest pains, headache. Reports they increased a new injection medication that made him feel "weird" ever since  And dizzy

## 2017-12-24 NOTE — Discharge Instructions (Signed)
Please follow-up with your ACT team tomorrow.  Please also follow-up with your primary care doctor in 1 week for reevaluation.  Please return to the emergency department immediately for any chest pain, shortness of breath, headaches, lightheadedness, dizziness, or any new or worsening symptoms.

## 2018-05-18 ENCOUNTER — Encounter (HOSPITAL_COMMUNITY): Payer: Self-pay | Admitting: Emergency Medicine

## 2018-05-18 ENCOUNTER — Emergency Department (HOSPITAL_COMMUNITY)
Admission: EM | Admit: 2018-05-18 | Discharge: 2018-05-18 | Disposition: A | Discharge status: Home / Self Care | Payer: Medicaid Other | Attending: Emergency Medicine | Admitting: Emergency Medicine

## 2018-05-18 DIAGNOSIS — Z79899 Other long term (current) drug therapy: Secondary | ICD-10-CM | POA: Insufficient documentation

## 2018-05-18 DIAGNOSIS — I10 Essential (primary) hypertension: Secondary | ICD-10-CM | POA: Diagnosis not present

## 2018-05-18 DIAGNOSIS — Z7982 Long term (current) use of aspirin: Secondary | ICD-10-CM | POA: Diagnosis not present

## 2018-05-18 DIAGNOSIS — F17210 Nicotine dependence, cigarettes, uncomplicated: Secondary | ICD-10-CM | POA: Insufficient documentation

## 2018-05-18 DIAGNOSIS — R1909 Other intra-abdominal and pelvic swelling, mass and lump: Secondary | ICD-10-CM | POA: Insufficient documentation

## 2018-05-18 DIAGNOSIS — R21 Rash and other nonspecific skin eruption: Secondary | ICD-10-CM | POA: Insufficient documentation

## 2018-05-18 DIAGNOSIS — D1724 Benign lipomatous neoplasm of skin and subcutaneous tissue of left leg: Secondary | ICD-10-CM

## 2018-05-18 DIAGNOSIS — L02211 Cutaneous abscess of abdominal wall: Secondary | ICD-10-CM

## 2018-05-18 MED ORDER — SULFAMETHOXAZOLE-TRIMETHOPRIM 800-160 MG PO TABS: 1.0000 | ORAL_TABLET | Freq: Two times a day (BID) | ORAL | 0 refills | Status: AC

## 2018-05-18 MED ORDER — SULFAMETHOXAZOLE-TRIMETHOPRIM 800-160 MG PO TABS
1.0000 | ORAL_TABLET | Freq: Once | ORAL | Status: AC
  Administered 2018-05-18: 1 via ORAL
  Filled 2018-05-18: qty 1

## 2018-05-18 NOTE — ED Triage Notes (Signed)
Pt reports that he got schizo med injection on Tuesday in left abd and has knot where injection site was since. Reports that he normally gets his injections lower abd and still has knot where he got one there before.  Also c/o knot on top of left foot x 6 months that hurts when he stands to walk after sitting for periods of time.

## 2018-05-18 NOTE — ED Provider Notes (Signed)
[facility] DEPT Provider Note   CSN: [id number] Arrival date & time: 05/18/18  0846     History   Chief Complaint Chief Complaint  Patient presents with  . injection site reaction  . knot foot    HPI Ryan Choi is a 47 y.o. male history of schizophrenia, hypertension here presenting with tender knot in the left side of his abdomen.  Patient states that he gets monthly injection of risperdone and had an injection done 2 days ago.  He states that he noticed some redness around the area but denies any drainage.  He states that he gets this injection every month but never had a reaction like this before.  He also noticed a spot on the left foot that is been there for the last 6 months and is slowly getting larger so wanted to get checked out as well.  Patient denies any history of diabetes or recurrent abscesses.  He denies any fevers or chills.  The history is provided by the patient.    Past Medical History:  Diagnosis Date  . Hypercholesteremia   . Hypertension   . Schizo affective schizophrenia [facility])     Patient Active Problem List   Diagnosis Date Noted  . Hyperprolactinemia ([place]) 04/26/2015  . HTN (hypertension) 04/24/2015  . Hyperlipidemia 04/24/2015  . Cocaine use disorder, moderate, dependence ([place]) 04/24/2015  . Alcohol use disorder, moderate, dependence ([place]) 04/24/2015  . Cannabis use disorder, severe, dependence ([place]) 04/24/2015  . Tobacco use disorder 04/24/2015  . Schizoaffective disorder, bipolar type ([place]) 04/23/2015  . ERECTILE DYSFUNCTION 06/05/2007    Past Surgical History:  Procedure Laterality Date  . CHOLECYSTECTOMY          Home Medications    Prior to Admission medications   Medication Sig Start Date End Date Taking? Authorizing Provider  albuterol (PROVENTIL HFA;VENTOLIN HFA) 108 (90 BASE) MCG/ACT inhaler Inhale 1-2 puffs into the lungs every 6 (six) hours as needed for wheezing or shortness of  breath. Patient not taking: Reported on 12/23/2017 02/12/15   Leo Grosser, MD  amantadine (SYMMETREL) 100 MG capsule Take 1 capsule (100 mg total) by mouth 2 (two) times daily. Patient not taking: Reported on 12/23/2017 06/08/16   Patrecia Pour, NP  amLODipine (NORVASC) 10 MG tablet Take 10 mg by mouth daily.    [provider]  aspirin EC 81 MG tablet Take 81 mg by mouth daily.    [provider]  carbamazepine (TEGRETOL XR) 200 MG 12 hr tablet Take 1 tablet (200 mg total) by mouth 2 (two) times daily. Patient not taking: Reported on 12/23/2017 06/08/16   Patrecia Pour, NP  cetirizine (ZYRTEC ALLERGY) 10 MG tablet Take 1 tablet (10 mg total) by mouth daily. 08/18/17   Sherwood Gambler, MD  fluPHENAZine (PROLIXIN) 10 MG tablet Take 1 tablet (10 mg total) by mouth every evening. Patient not taking: Reported on 12/23/2017 06/08/16   Patrecia Pour, NP  gabapentin (NEURONTIN) 300 MG capsule Take 300 mg by mouth 3 (three) times daily.    [provider]  hydrochlorothiazide (HYDRODIURIL) 25 MG tablet Take 1 tablet (25 mg total) by mouth daily. 06/08/16   Patrecia Pour, NP  Melatonin 3 MG TABS Take 1 tablet by mouth daily.    [provider]  mirtazapine (REMERON) 15 MG tablet Take 1 tablet (15 mg total) by mouth at bedtime. Patient not taking: Reported on 12/23/2017 06/08/16   Patrecia Pour, NP  nicotine (  NICODERM CQ - DOSED IN MG/24 HOURS) 21 mg/24hr patch Place 1 patch (21 mg total) onto the skin daily. Patient not taking: Reported on 04/08/2016 04/29/15   Niel Hummer, NP  risperidone (RISPERDAL) 4 MG tablet Take 1 tablet (4 mg total) by mouth 2 (two) times daily. Patient not taking: Reported on 12/23/2017 06/08/16   Patrecia Pour, NP  risperiDONE ER (PERSERIS) 120 MG PRSY Inject 120 mg into the skin every 30 (thirty) days.    [provider]  traZODone (DESYREL) 100 MG tablet Take 1 tablet (100 mg total) by mouth at bedtime as needed for sleep. Patient  taking differently: Take 100 mg by mouth at bedtime.  06/08/16   Patrecia Pour, NP    Family History Family History  Problem Relation Age of Onset  . Hypertension Mother   . Diabetes Mother   . Mental illness Neg Hx     Social History Social History   Tobacco Use  . Smoking status: Current Every Day Smoker    Packs/day: 0.50    Types: Cigarettes  . Smokeless tobacco: Never Used  Substance Use Topics  . Alcohol use: Yes    Alcohol/week: 2.0 standard drinks    Types: 2 Cans of beer per week  . Drug use: Yes    Types: Cocaine, Marijuana     Allergies   Ibuprofen; Omeprazole; and Tylenol [acetaminophen]   Review of Systems Review of Systems  Skin: Positive for rash.  All other systems reviewed and are negative.    Physical Exam Updated Vital Signs BP (!) 146/99 (BP Location: Left Arm)   Pulse 84   Temp 98.1 F (36.7 C) (Oral)   Resp 18   SpO2 99%   Physical Exam  Constitutional: He appears well-developed.  HENT:  Head: Normocephalic.  Mouth/Throat: Oropharynx is clear and moist.  Eyes: Pupils are equal, round, and reactive to light. Conjunctivae and EOM are normal.  Neck: Normal range of motion. Neck supple.  Cardiovascular: Normal rate, regular rhythm and normal heart sounds.  Pulmonary/Chest: Effort normal and breath sounds normal. No stridor. No respiratory distress. He has no wheezes.  Abdominal: Soft. Bowel sounds are normal. He exhibits no distension. There is no tenderness.  Tender nodule L mid abdomen, no obvious fluctuance, mild surrounding erythema. No obvious vesicles   Musculoskeletal: Normal range of motion.  Dorsum of L foot with lipoma that is mobile. 2+ pulses, able to wiggle toes   Neurological: He is alert.  Skin: Skin is warm. There is erythema.  Psychiatric: He has a normal mood and affect.  Nursing note and vitals reviewed.    ED Treatments / Results  Labs (all labs ordered are listed, but only abnormal results are  displayed) Labs Reviewed - No data to display  EKG None  Radiology No results found.  Procedures Procedures (including critical care time)  EMERGENCY DEPARTMENT US SOFT TISSUE INTERPRETATION "Study: Limited Soft Tissue Ultrasound"  INDICATIONS: Soft tissue infection Multiple views of the body part were obtained in real-time with a multi-frequency linear probe  PERFORMED BY: Myself IMAGES ARCHIVED?: Yes SIDE:Left BODY PART:Abdominal wall INTERPRETATION: Abscess vs persistent fluid from the recent injection      Medications Ordered in ED Medications  sulfamethoxazole-trimethoprim (BACTRIM DS,SEPTRA DS) 800-160 MG per tablet 1 tablet (has no administration in time range)     Initial Impression / Assessment and Plan / ED Course  I have reviewed the triage vital signs and the nursing notes.  Pertinent labs &  imaging results that were available during my care of the patient were reviewed by me and considered in my medical decision making (see chart for details).     DERREN SUYDAM is a 47 y.o. male here with L abdominal nodule after injection. I think likely early abscess vs delayed absorption of the injection. Afebrile, well appearing. I recommend warm compresses, short course of bactrim. I think I and D at this point will remove the risperdone that was injected so I want to hold off for now. He had R foot nodule likely lipoma that has been there for 6 months and he is neurovascular intact. I will refer him to podiatry for follow up    Final Clinical Impressions(s) / ED Diagnoses   Final diagnoses:  None    ED Discharge Orders    None       Drenda Freeze, MD 05/18/18 1020

## 2018-05-18 NOTE — Discharge Instructions (Signed)
Please use warm compresses on your abdomen to help the medicine to absorb. Take bactrim twice daily for 5 days for possible early skin infection.   You have lipoma in your foot. See Dr. Amalia Hailey if you want it removed   Return to ER if you have fever, worse redness or swelling, severe abdominal pain or foot pain

## 2019-01-10 ENCOUNTER — Telehealth: Payer: Self-pay | Admitting: Family Medicine

## 2019-01-10 NOTE — Telephone Encounter (Signed)
Rec'd phone call from a male, named Judeen Hammans, that stated the patient is the father to one of her children.   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  Stated that he becomes very territorial and defensive, when she tries to kill any of the bedbugs.  She made comparison to the movie "[place]", regarding the situation she is experiencing and the behavior that the patient is exhibiting.  Since no DPR in place, advised if she has concerns about the pt's behavior, she would need to take him to the ER.  Also advised the pt. Should contact his PCP.  Stated "he is not going to want to go to the ER."  Also reported "he walks with a limp".  Requested that this nurse notify his PCP.  Advised that the pt. should contact his PCP with any symptoms or concerns.    Attempted to contact Dr. Macky Lower office; received message that the number had been disconnected / no longer in service.

## 2019-03-13 IMAGING — CR DG CHEST 2V
2 series · 2 of 2 positions shown · non-contrast
Comparison: Chest radiograph 02/12/2015

CLINICAL DATA: Chest pain

EXAM:
CHEST - 2 VIEW

[w chest pa]
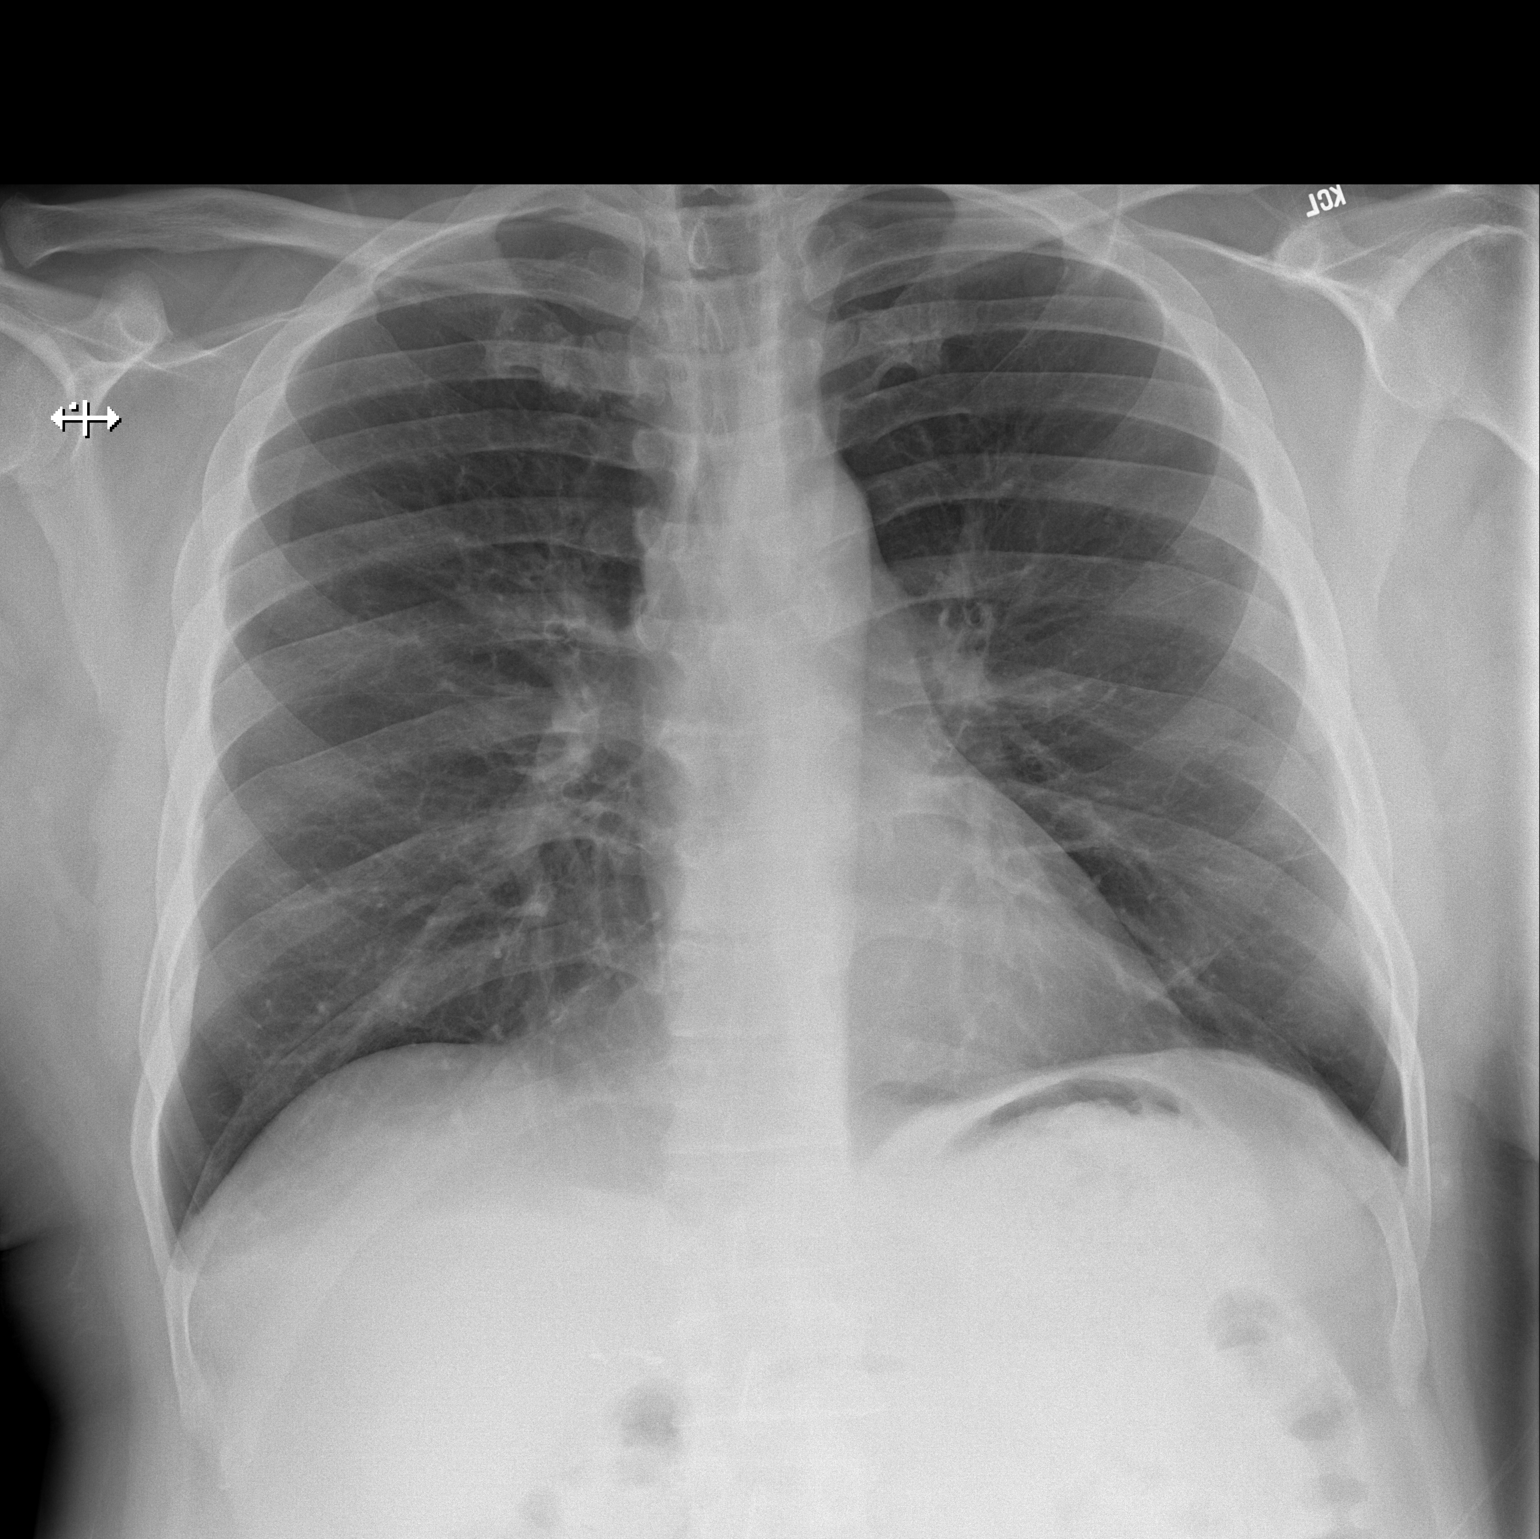

[w chest lat]
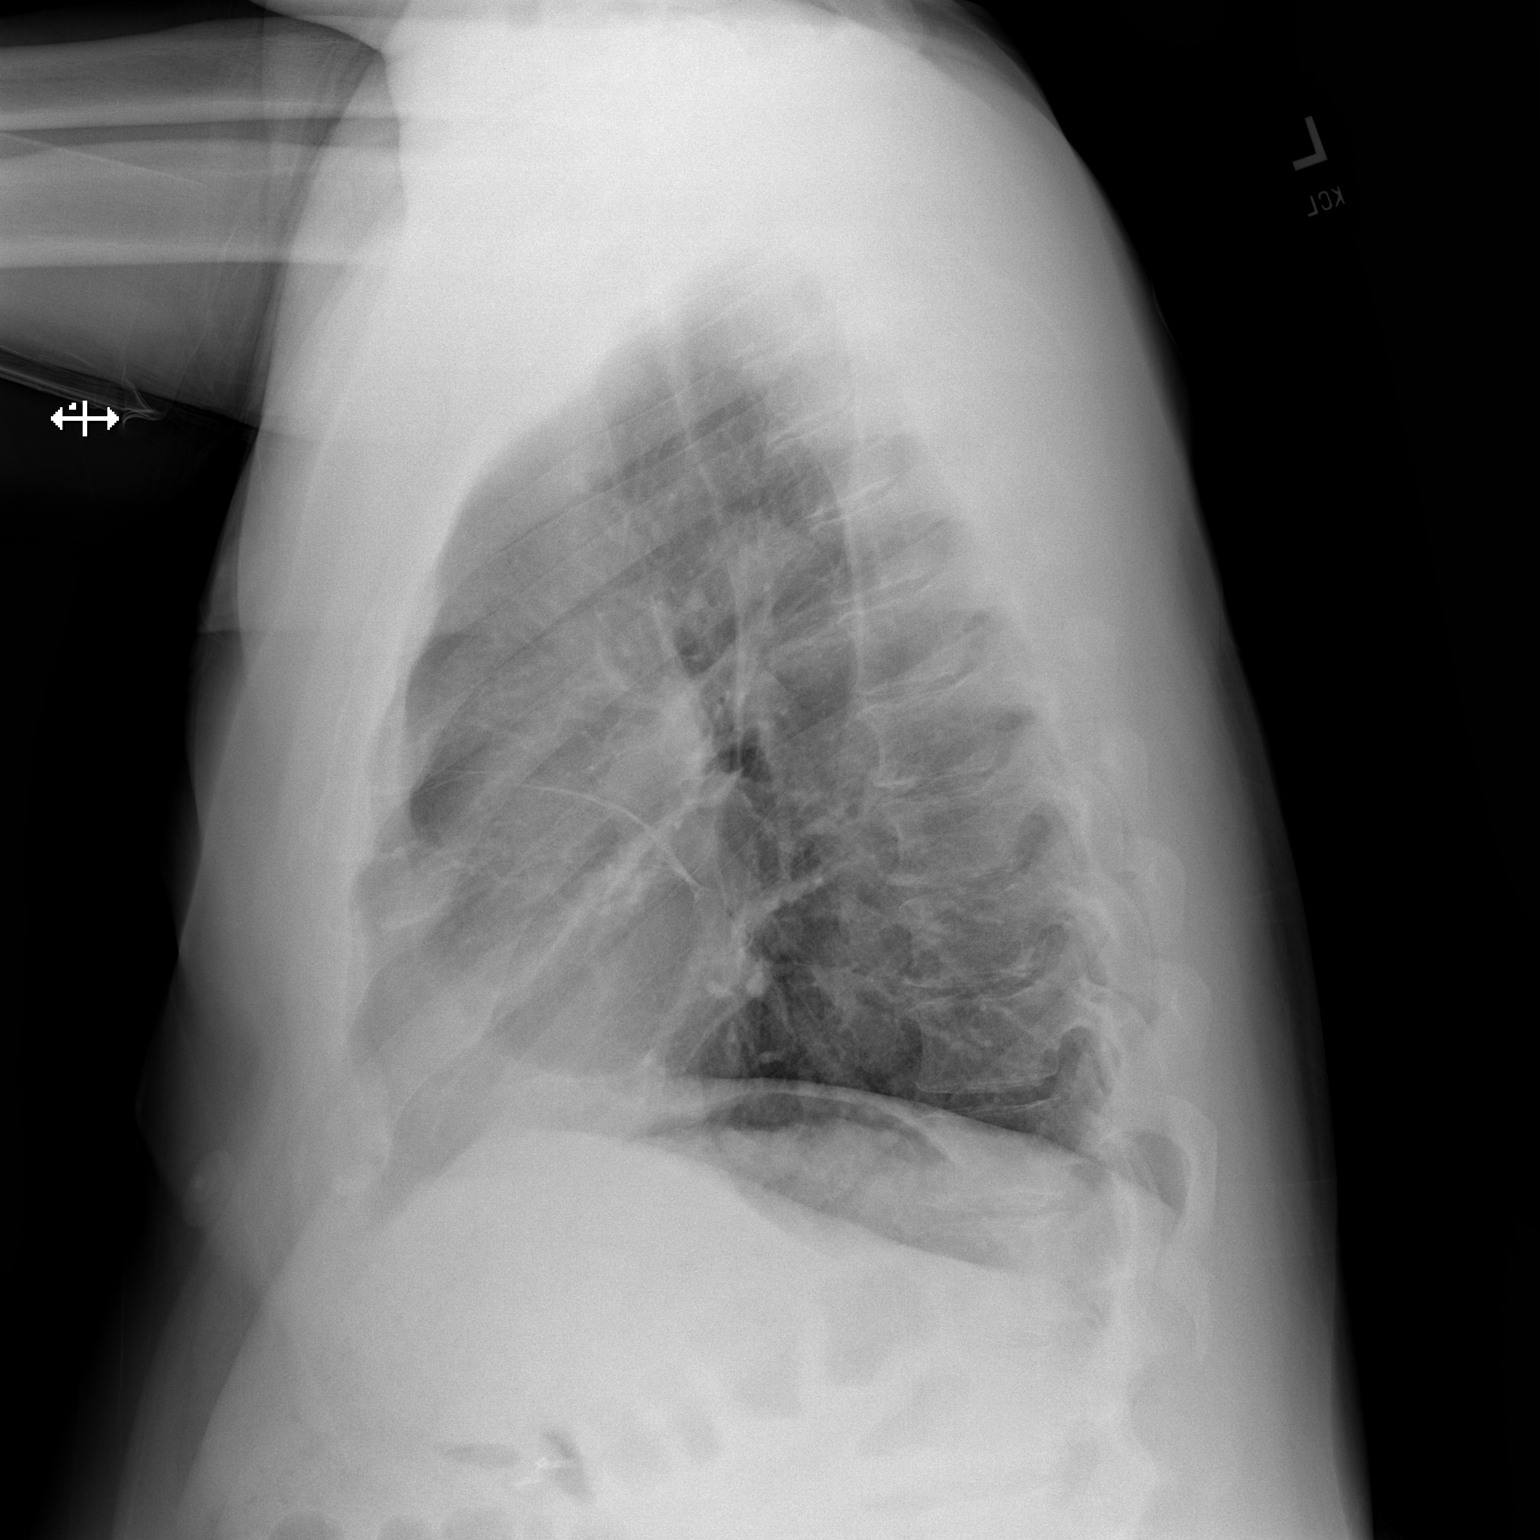

[2 of 2 positions shown; findings below may reference images not displayed]

FINDINGS: The heart size and mediastinal contours are within normal limits.
Both lungs are clear. The visualized skeletal structures are
unremarkable.
IMPRESSION: No active cardiopulmonary disease.

## 2019-08-24 ENCOUNTER — Other Ambulatory Visit: Payer: Self-pay

## 2019-08-24 ENCOUNTER — Encounter: Payer: Self-pay | Admitting: Podiatry

## 2019-08-24 ENCOUNTER — Ambulatory Visit (INDEPENDENT_AMBULATORY_CARE_PROVIDER_SITE_OTHER): Payer: Medicaid Other | Admitting: Podiatry

## 2019-08-24 VITALS — BP 146/79

## 2019-08-24 DIAGNOSIS — L603 Nail dystrophy: Secondary | ICD-10-CM | POA: Diagnosis not present

## 2019-08-24 DIAGNOSIS — M79676 Pain in unspecified toe(s): Secondary | ICD-10-CM | POA: Diagnosis not present

## 2019-08-24 DIAGNOSIS — B351 Tinea unguium: Secondary | ICD-10-CM

## 2019-08-24 DIAGNOSIS — Q828 Other specified congenital malformations of skin: Secondary | ICD-10-CM | POA: Diagnosis not present

## 2019-08-24 NOTE — Progress Notes (Signed)
  Subjective:  Patient ID: BRIGHT KILLE, male    DOB: 09/05/1971,  MRN: [id number]  Chief Complaint  Patient presents with  . Nail Problem    pt is here for toenail fungus, pt also states that he is here for a toenail trim as well as possible callus   48 y.o. male returns for the above complaint.  Patient presents with a very thickened elongated dystrophic mycotic left fifth digit toenail.  Patient states that this has been growing since 2011 and is now curving and causing him pressure pain in the fifth digit.  He states he never had it removed debrided down because it never caused him pain.  However now is causing him a lot of pain from the pressure into the skin.  He also has a secondary complaint of hyperkeratotic lesion to the right medial hallux/pinch callus.  He states that this has been forming over the years but is now starting to hurt him.  This all of this hurts when ambulating.  He denies any alleviating factors or seeing any other podiatrist for this.  Objective:   Vitals:   08/24/19 1406  BP: (!) 146/79   Podiatric Exam: Vascular: dorsalis pedis and posterior tibial pulses are palpable bilateral. Capillary return is immediate. Temperature gradient is WNL. Skin turgor WNL  Sensorium: Normal Semmes Weinstein monofilament test. Normal tactile sensation bilaterally. Nail Exam: Normal nail exam except for left fifth digit thick elongated mycotic curved toenail with pressure in the fifth digit skin.  No underlying ulceration noted upon debridement Ulcer Exam: There is no evidence of ulcer or pre-ulcerative changes or infection. Orthopedic Exam: Muscle tone and strength are WNL. No limitations in general ROM. No crepitus or effusions noted. HAV  B/L.  Hammer toes 2-5  B/L. Skin: No Porokeratosis. No infection or ulcers.  Hyperkeratotic lesion noted on the right hallux medial aspect/pinch callus  Assessment & Plan:  Patient was evaluated and treated and all questions answered.   Onychomycosis with pain of the left fifth digit/rams horn -Nails palliatively debrided as below. -Educated on self-care  Porokeratosis/hyperkeratotic lesion -Using a chisel blade and a handle, porokeratosis were aggressively debrided down to healthy striated tissue without any pinpoint bleeding.  Procedure: Nail Debridement Rationale: pain  Type of Debridement: manual, sharp debridement. Instrumentation: Nail nipper, rotary burr. Number of Nails: 1  Procedures and Treatment: Consent by patient was obtained for treatment procedures. The patient understood the discussion of treatment and procedures well. All questions were answered thoroughly reviewed. Debridement of mycotic and hypertrophic toenails, 1 through 5 bilateral and clearing of subungual debris. No ulceration, no infection noted.  Return Visit-Office Procedure: Patient instructed to return to the office for a follow up visit 3 months for continued evaluation and treatment.  Boneta Lucks, DPM    No follow-ups on file.

## 2020-03-12 ENCOUNTER — Emergency Department (HOSPITAL_COMMUNITY): Payer: Medicaid Other

## 2020-03-12 ENCOUNTER — Other Ambulatory Visit: Payer: Self-pay

## 2020-03-12 ENCOUNTER — Encounter (HOSPITAL_COMMUNITY): Payer: Self-pay | Admitting: Emergency Medicine

## 2020-03-12 ENCOUNTER — Emergency Department (HOSPITAL_COMMUNITY)
Admission: EM | Admit: 2020-03-12 | Discharge: 2020-03-12 | Disposition: A | Discharge status: Home / Self Care | Payer: Medicaid Other | Attending: Emergency Medicine | Admitting: Emergency Medicine

## 2020-03-12 DIAGNOSIS — M546 Pain in thoracic spine: Secondary | ICD-10-CM | POA: Diagnosis not present

## 2020-03-12 DIAGNOSIS — Y999 Unspecified external cause status: Secondary | ICD-10-CM | POA: Insufficient documentation

## 2020-03-12 DIAGNOSIS — S0592XA Unspecified injury of left eye and orbit, initial encounter: Secondary | ICD-10-CM | POA: Diagnosis present

## 2020-03-12 DIAGNOSIS — S52025A Nondisplaced fracture of olecranon process without intraarticular extension of left ulna, initial encounter for closed fracture: Secondary | ICD-10-CM | POA: Insufficient documentation

## 2020-03-12 DIAGNOSIS — S0512XA Contusion of eyeball and orbital tissues, left eye, initial encounter: Secondary | ICD-10-CM | POA: Diagnosis not present

## 2020-03-12 DIAGNOSIS — S0990XA Unspecified injury of head, initial encounter: Secondary | ICD-10-CM | POA: Insufficient documentation

## 2020-03-12 DIAGNOSIS — Z79899 Other long term (current) drug therapy: Secondary | ICD-10-CM | POA: Diagnosis not present

## 2020-03-12 DIAGNOSIS — Y929 Unspecified place or not applicable: Secondary | ICD-10-CM | POA: Diagnosis not present

## 2020-03-12 DIAGNOSIS — S20311A Abrasion of right front wall of thorax, initial encounter: Secondary | ICD-10-CM | POA: Diagnosis not present

## 2020-03-12 DIAGNOSIS — F17210 Nicotine dependence, cigarettes, uncomplicated: Secondary | ICD-10-CM | POA: Diagnosis not present

## 2020-03-12 DIAGNOSIS — I10 Essential (primary) hypertension: Secondary | ICD-10-CM | POA: Diagnosis not present

## 2020-03-12 DIAGNOSIS — S20211A Contusion of right front wall of thorax, initial encounter: Secondary | ICD-10-CM | POA: Insufficient documentation

## 2020-03-12 DIAGNOSIS — S52022A Displaced fracture of olecranon process without intraarticular extension of left ulna, initial encounter for closed fracture: Secondary | ICD-10-CM

## 2020-03-12 DIAGNOSIS — Y939 Activity, unspecified: Secondary | ICD-10-CM | POA: Insufficient documentation

## 2020-03-12 DIAGNOSIS — E669 Obesity, unspecified: Secondary | ICD-10-CM | POA: Diagnosis not present

## 2020-03-12 DIAGNOSIS — M545 Low back pain: Secondary | ICD-10-CM | POA: Insufficient documentation

## 2020-03-12 MED ORDER — OXYCODONE HCL 5 MG PO TABS
10.0000 mg | ORAL_TABLET | ORAL | Status: AC
  Administered 2020-03-12: 10 mg via ORAL
  Filled 2020-03-12: qty 2

## 2020-03-12 MED ORDER — METHOCARBAMOL 500 MG PO TABS: 500.0000 mg | ORAL_TABLET | Freq: Two times a day (BID) | ORAL | 0 refills | Status: AC

## 2020-03-12 MED ORDER — METHOCARBAMOL 500 MG PO TABS
500.0000 mg | ORAL_TABLET | Freq: Two times a day (BID) | ORAL | 0 refills | Status: DC
Start: 2020-03-12 — End: 2020-03-12

## 2020-03-12 MED ORDER — ERYTHROMYCIN 5 MG/GM OP OINT: TOPICAL_OINTMENT | OPHTHALMIC | 0 refills | Status: AC

## 2020-03-12 MED ORDER — FLUORESCEIN SODIUM 1 MG OP STRP
1.0000 | ORAL_STRIP | Freq: Once | OPHTHALMIC | Status: AC
  Administered 2020-03-12: 1 via OPHTHALMIC
  Filled 2020-03-12: qty 1

## 2020-03-12 MED ORDER — ERYTHROMYCIN 5 MG/GM OP OINT
TOPICAL_OINTMENT | OPHTHALMIC | 0 refills | Status: DC
Start: 2020-03-12 — End: 2020-03-12

## 2020-03-12 MED ORDER — TETRACAINE HCL 0.5 % OP SOLN
2.0000 [drp] | Freq: Once | OPHTHALMIC | Status: AC
  Administered 2020-03-12: 2 [drp] via OPHTHALMIC
  Filled 2020-03-12: qty 4

## 2020-03-12 NOTE — ED Notes (Signed)
Patient instructed to take BP medication, per PA request.

## 2020-03-12 NOTE — Discharge Instructions (Addendum)
Please use the erythromycin as prescribed. This will prevent any infections. Your CT scans were reassuring. Patient plenty of water follow-up with the ophthalmologist. I have provided you with a referral to ophthalmology. He has a very small fracture of the bones of your elbow however it is not displaced. I provided you with orthopedic follow-up however rest ice and elevation in a sling will probably be the most helpful. Please use Tylenol and ibuprofen for pain.  I have also provided you with a muscle relaxer for pain
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
## 2020-03-12 NOTE — ED Notes (Signed)
Pt reports hasnt had his HTN medications in several days.
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
## 2020-03-17 ENCOUNTER — Other Ambulatory Visit: Payer: Self-pay

## 2020-03-17 ENCOUNTER — Emergency Department (HOSPITAL_COMMUNITY)
Admission: EM | Admit: 2020-03-17 | Discharge: 2020-03-17 | Disposition: A | Discharge status: Home / Self Care | Payer: Medicaid Other | Attending: Emergency Medicine | Admitting: Emergency Medicine

## 2020-03-17 ENCOUNTER — Encounter (HOSPITAL_COMMUNITY): Payer: Self-pay

## 2020-03-17 DIAGNOSIS — F17210 Nicotine dependence, cigarettes, uncomplicated: Secondary | ICD-10-CM | POA: Diagnosis not present

## 2020-03-17 DIAGNOSIS — Y999 Unspecified external cause status: Secondary | ICD-10-CM | POA: Diagnosis not present

## 2020-03-17 DIAGNOSIS — S0592XD Unspecified injury of left eye and orbit, subsequent encounter: Secondary | ICD-10-CM

## 2020-03-17 DIAGNOSIS — I10 Essential (primary) hypertension: Secondary | ICD-10-CM | POA: Insufficient documentation

## 2020-03-17 DIAGNOSIS — Y9389 Activity, other specified: Secondary | ICD-10-CM | POA: Insufficient documentation

## 2020-03-17 DIAGNOSIS — S0592XA Unspecified injury of left eye and orbit, initial encounter: Secondary | ICD-10-CM | POA: Insufficient documentation

## 2020-03-17 DIAGNOSIS — Z7982 Long term (current) use of aspirin: Secondary | ICD-10-CM | POA: Insufficient documentation

## 2020-03-17 DIAGNOSIS — Z79899 Other long term (current) drug therapy: Secondary | ICD-10-CM | POA: Insufficient documentation

## 2020-03-17 DIAGNOSIS — W228XXA Striking against or struck by other objects, initial encounter: Secondary | ICD-10-CM | POA: Insufficient documentation

## 2020-03-17 DIAGNOSIS — H5789 Other specified disorders of eye and adnexa: Secondary | ICD-10-CM

## 2020-03-17 DIAGNOSIS — S0591XA Unspecified injury of right eye and orbit, initial encounter: Secondary | ICD-10-CM | POA: Insufficient documentation

## 2020-03-17 DIAGNOSIS — Y9289 Other specified places as the place of occurrence of the external cause: Secondary | ICD-10-CM | POA: Diagnosis not present

## 2020-03-17 MED ORDER — ERYTHROMYCIN 5 MG/GM OP OINT
TOPICAL_OINTMENT | OPHTHALMIC | 0 refills | Status: AC
Start: 2020-03-17 — End: ?

## 2020-03-17 MED ORDER — TETRACAINE HCL 0.5 % OP SOLN
2.0000 [drp] | Freq: Once | OPHTHALMIC | Status: AC
  Administered 2020-03-17: 2 [drp] via OPHTHALMIC
  Filled 2020-03-17: qty 4

## 2020-03-17 MED ORDER — ERYTHROMYCIN 5 MG/GM OP OINT
TOPICAL_OINTMENT | Freq: Once | OPHTHALMIC | Status: AC
  Administered 2020-03-17: 1 via OPHTHALMIC
  Filled 2020-03-17: qty 3.5

## 2020-03-17 MED ORDER — CYCLOBENZAPRINE HCL 10 MG PO TABS
10.0000 mg | ORAL_TABLET | Freq: Every day | ORAL | 0 refills | Status: AC
Start: 2020-03-17 — End: ?

## 2020-03-17 MED ORDER — CYCLOBENZAPRINE HCL 10 MG PO TABS
5.0000 mg | ORAL_TABLET | Freq: Once | ORAL | Status: AC
  Administered 2020-03-17: 5 mg via ORAL
  Filled 2020-03-17: qty 1

## 2020-03-17 MED ORDER — FLUORESCEIN SODIUM 1 MG OP STRP
2.0000 | ORAL_STRIP | Freq: Once | OPHTHALMIC | Status: AC
  Administered 2020-03-17: 2 via OPHTHALMIC
  Filled 2020-03-17: qty 2

## 2020-03-17 NOTE — Discharge Instructions (Signed)
I have prescribed you 2 medications.  The first medication is called erythromycin.  This is the medication you will apply to your lower eyelid up to 4 times per day.  This should help with your eye irritation.  I am prescribing you a strong muscle relaxer called flexeril. Please only take this medication once in the evening with dinner. This medication can make you quite drowsy. Do not mix it with alcohol. Do not drive a vehicle after taking it. Do not take this medication along with methocarbamol.  Please return to the emergency department with any new or worsening symptoms.  Please follow-up with your primary care provider if your symptoms do not improve in the next 2 to 3 days.  It was a pleasure to meet you.
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
## 2020-03-17 NOTE — ED Notes (Signed)
Pt states that he needs to see a psychiatrist due to his schizophrenia and increased aggitation.  Pt also c/o sensitivity to light.
